# Patient Record
Sex: Female | Born: 1960
Health system: Southern US, Community
[De-identification: ages and names within clinical notes are randomized; demographics above are authoritative.]

## PROBLEM LIST (undated history)

## (undated) DIAGNOSIS — T7840XA Allergy, unspecified, initial encounter: Secondary | ICD-10-CM

## (undated) DIAGNOSIS — N200 Calculus of kidney: Secondary | ICD-10-CM

## (undated) DIAGNOSIS — E042 Nontoxic multinodular goiter: Secondary | ICD-10-CM

## (undated) DIAGNOSIS — S73191A Other sprain of right hip, initial encounter: Secondary | ICD-10-CM

## (undated) DIAGNOSIS — Z87442 Personal history of urinary calculi: Secondary | ICD-10-CM

## (undated) DIAGNOSIS — E785 Hyperlipidemia, unspecified: Secondary | ICD-10-CM

## (undated) DIAGNOSIS — M810 Age-related osteoporosis without current pathological fracture: Secondary | ICD-10-CM

## (undated) DIAGNOSIS — T8859XA Other complications of anesthesia, initial encounter: Secondary | ICD-10-CM

## (undated) DIAGNOSIS — K603 Anal fistula, unspecified: Secondary | ICD-10-CM

## (undated) DIAGNOSIS — K648 Other hemorrhoids: Secondary | ICD-10-CM

## (undated) DIAGNOSIS — Z973 Presence of spectacles and contact lenses: Secondary | ICD-10-CM

## (undated) DIAGNOSIS — F419 Anxiety disorder, unspecified: Secondary | ICD-10-CM

## (undated) DIAGNOSIS — M1990 Unspecified osteoarthritis, unspecified site: Secondary | ICD-10-CM

## (undated) DIAGNOSIS — T4145XA Adverse effect of unspecified anesthetic, initial encounter: Secondary | ICD-10-CM

## (undated) HISTORY — PX: BREAST SURGERY: SHX581

## (undated) HISTORY — DX: Other sprain of right hip, initial encounter: S73.191A

## (undated) HISTORY — DX: Allergy, unspecified, initial encounter: T78.40XA

## (undated) HISTORY — DX: Anxiety disorder, unspecified: F41.9

## (undated) HISTORY — PX: TONSILLECTOMY: SUR1361

## (undated) HISTORY — PX: LAPAROSCOPY: SHX197

## (undated) HISTORY — DX: Unspecified osteoarthritis, unspecified site: M19.90

## (undated) HISTORY — DX: Hyperlipidemia, unspecified: E78.5

## (undated) HISTORY — DX: Age-related osteoporosis without current pathological fracture: M81.0

## (undated) HISTORY — PX: REVISION OF SCAR: SHX2348

## (undated) HISTORY — DX: Calculus of kidney: N20.0

## (undated) HISTORY — PX: COLONOSCOPY: SHX174

## (undated) HISTORY — PX: CARDIOVASCULAR STRESS TEST: SHX262

---

## 1998-10-08 ENCOUNTER — Other Ambulatory Visit: Admission: RE | Admit: 1998-10-08 | Discharge: 1998-10-08 | Payer: Self-pay | Admitting: Obstetrics and Gynecology

## 1999-06-19 ENCOUNTER — Encounter: Admission: RE | Admit: 1999-06-19 | Discharge: 1999-06-19 | Payer: Self-pay | Admitting: Gastroenterology

## 1999-06-19 ENCOUNTER — Encounter: Payer: Self-pay | Admitting: Gastroenterology

## 1999-07-31 ENCOUNTER — Ambulatory Visit (HOSPITAL_COMMUNITY): Admission: RE | Admit: 1999-07-31 | Discharge: 1999-07-31 | Payer: Self-pay | Admitting: Urology

## 1999-07-31 ENCOUNTER — Encounter: Payer: Self-pay | Admitting: Urology

## 1999-10-09 ENCOUNTER — Other Ambulatory Visit: Admission: RE | Admit: 1999-10-09 | Discharge: 1999-10-09 | Payer: Self-pay | Admitting: Obstetrics and Gynecology

## 2000-10-08 ENCOUNTER — Other Ambulatory Visit: Admission: RE | Admit: 2000-10-08 | Discharge: 2000-10-08 | Payer: Self-pay | Admitting: Obstetrics and Gynecology

## 2001-10-10 ENCOUNTER — Other Ambulatory Visit: Admission: RE | Admit: 2001-10-10 | Discharge: 2001-10-10 | Payer: Self-pay | Admitting: Obstetrics and Gynecology

## 2002-09-07 ENCOUNTER — Other Ambulatory Visit: Admission: RE | Admit: 2002-09-07 | Discharge: 2002-09-07 | Payer: Self-pay | Admitting: Obstetrics and Gynecology

## 2003-09-10 ENCOUNTER — Other Ambulatory Visit: Admission: RE | Admit: 2003-09-10 | Discharge: 2003-09-10 | Payer: Self-pay | Admitting: Obstetrics and Gynecology

## 2004-04-21 ENCOUNTER — Encounter: Admission: RE | Admit: 2004-04-21 | Discharge: 2004-04-21 | Payer: Self-pay | Admitting: Internal Medicine

## 2004-09-11 ENCOUNTER — Other Ambulatory Visit: Admission: RE | Admit: 2004-09-11 | Discharge: 2004-09-11 | Payer: Self-pay | Admitting: Obstetrics and Gynecology

## 2005-01-02 ENCOUNTER — Encounter: Admission: RE | Admit: 2005-01-02 | Discharge: 2005-01-02 | Payer: Self-pay | Admitting: Internal Medicine

## 2005-09-15 ENCOUNTER — Other Ambulatory Visit: Admission: RE | Admit: 2005-09-15 | Discharge: 2005-09-15 | Payer: Self-pay | Admitting: Obstetrics and Gynecology

## 2006-09-20 ENCOUNTER — Other Ambulatory Visit: Admission: RE | Admit: 2006-09-20 | Discharge: 2006-09-20 | Payer: Self-pay | Admitting: Obstetrics and Gynecology

## 2008-03-16 HISTORY — PX: LITHOTRIPSY: SUR834

## 2011-04-08 ENCOUNTER — Other Ambulatory Visit: Payer: Self-pay | Admitting: Orthopedic Surgery

## 2011-04-08 DIAGNOSIS — M25512 Pain in left shoulder: Secondary | ICD-10-CM

## 2011-04-12 ENCOUNTER — Ambulatory Visit
Admission: RE | Admit: 2011-04-12 | Discharge: 2011-04-12 | Disposition: A | Discharge status: Home / Self Care | Payer: 59 | Source: Ambulatory Visit | Attending: Orthopedic Surgery | Admitting: Orthopedic Surgery

## 2011-04-12 DIAGNOSIS — M25512 Pain in left shoulder: Secondary | ICD-10-CM

## 2012-05-10 ENCOUNTER — Encounter (INDEPENDENT_AMBULATORY_CARE_PROVIDER_SITE_OTHER): Payer: Self-pay

## 2012-05-19 ENCOUNTER — Ambulatory Visit (INDEPENDENT_AMBULATORY_CARE_PROVIDER_SITE_OTHER): Payer: 59 | Admitting: Surgery

## 2012-05-19 ENCOUNTER — Encounter (INDEPENDENT_AMBULATORY_CARE_PROVIDER_SITE_OTHER): Payer: Self-pay | Admitting: Surgery

## 2012-05-19 ENCOUNTER — Other Ambulatory Visit (INDEPENDENT_AMBULATORY_CARE_PROVIDER_SITE_OTHER): Payer: Self-pay

## 2012-05-19 VITALS — BP 118/82 | HR 68 | Temp 97.3°F | Resp 16 | Ht 64.0 in | Wt 110.0 lb

## 2012-05-19 DIAGNOSIS — K6289 Other specified diseases of anus and rectum: Secondary | ICD-10-CM

## 2012-05-19 NOTE — Patient Instructions (Signed)
Followup with our office after the MRI of the pelvis

## 2012-05-19 NOTE — Progress Notes (Signed)
Chief Complaint:  Palpable knot on left anterior perirectal region  History of Present Illness:  Heather Chandler is an 52 y.o. female had a recent colonscopy per Dr. Randa Evens and was found to have a firm mass in the perianal region.  This is anterior and on the left side. It is about a centimeter in diameter and about a centimeter from the anal verge. On the inside there is palpable fullness and I would think this could have been a pararectal abscess; however she gives no history to suggest that she has ever had a perianal or perirectal abscess. Since this could be a sarcoma I think she would be better to image this before doing any invasive needle studies her biopsy. Hopefully this will be just scar tissue and any invasive procedure could be avoided.  History reviewed. No pertinent past medical history.  Past Surgical History  Procedure Laterality Date  . Laparoscopy    . Breast surgery      enhancement Sx  . Revision of scar      Current Outpatient Prescriptions  Medication Sig Dispense Refill  . diazepam (VALIUM) 5 MG tablet        No current facility-administered medications for this visit.   Ceclor; Codeine; and Penicillins Family History  Problem Relation Age of Onset  . Cancer Mother     breast & uterian  . Cancer Father     leukemia  . Cancer Maternal Aunt     breast  . Cancer Maternal Grandmother     breast  . Cancer Maternal Grandfather     lung   Social History:   reports that she has never smoked. She has never used smokeless tobacco. She reports that  drinks alcohol. She reports that she does not use illicit drugs.   REVIEW OF SYSTEMS - PERTINENT POSITIVES ONLY: No history of perirectal abscess.  Did have 1 episiotomy with childbirth  Physical Exam:   Blood pressure 118/82, pulse 68, temperature 97.3 F (36.3 C), temperature source Temporal, resp. rate 16, height 5\' 4"  (1.626 m), weight 110 lb (49.896 kg). Body mass index is 18.87 kg/(m^2).  Gen:  WDWN WF NAD   Neurological: Alert and oriented to person, place, and time. Motor and sensory function is grossly intact  Head: Normocephalic and atraumatic.  Eyes: Conjunctivae are normal. Pupils are equal, round, and reactive to light. No scleral icterus.  Rectal: Small anterior external hemorrhoid.  Palpable mass on the left anterior sphincter region about 1 cm in diameter and about 1 cm from the anal verge.  Musculoskeletal: Normal range of motion. Extremities are nontender. No cyanosis, edema or clubbing noted Lymphadenopathy: No cervical, preauricular, postauricular or axillary adenopathy is present Skin: Skin is warm and dry. No rash noted. No diaphoresis. No erythema. No pallor. Pscyh: Normal mood and affect. Behavior is normal. Judgment and thought content normal.   LABORATORY RESULTS: No results found for this or any previous visit (from the past 48 hour(s)).  RADIOLOGY RESULTS: No results found.  Problem List: There is no problem list on file for this patient.   Assessment & Plan: Perianal mass.  Will get MRI of pelvis to try to discern scar from possilbe muscle tumor    Matt B. Daphine Deutscher, MD, [facility] [phone] beeper [phone]  05/19/2012 10:16 AM

## 2012-05-22 ENCOUNTER — Ambulatory Visit
Admission: RE | Admit: 2012-05-22 | Discharge: 2012-05-22 | Disposition: A | Discharge status: Home / Self Care | Payer: 59 | Source: Ambulatory Visit | Attending: Surgery | Admitting: Surgery

## 2012-05-22 DIAGNOSIS — K6289 Other specified diseases of anus and rectum: Secondary | ICD-10-CM

## 2012-05-22 MED ORDER — GADOBENATE DIMEGLUMINE 529 MG/ML IV SOLN
10.0000 mL | Freq: Once | INTRAVENOUS | Status: AC | PRN
  Administered 2012-05-22: 10 mL via INTRAVENOUS

## 2012-05-24 ENCOUNTER — Telehealth (INDEPENDENT_AMBULATORY_CARE_PROVIDER_SITE_OTHER): Payer: Self-pay

## 2012-05-24 NOTE — Telephone Encounter (Signed)
LMOM letting the pt know that her imaging results were in.  I did not see anywhere that it is OK to leave results on VM.  Asked that pt call the office for results.  Also, I made and appointment for her to come in on Fri, April 7 @ 415p.

## 2012-05-24 NOTE — Telephone Encounter (Signed)
While documenting, pt returned my call.  Pt aware of results and appt.

## 2012-05-31 DIAGNOSIS — K6289 Other specified diseases of anus and rectum: Secondary | ICD-10-CM | POA: Insufficient documentation

## 2012-06-16 ENCOUNTER — Telehealth (INDEPENDENT_AMBULATORY_CARE_PROVIDER_SITE_OTHER): Payer: Self-pay | Admitting: *Deleted

## 2012-06-16 NOTE — Telephone Encounter (Signed)
Patient called to state that she would prefer to not have to come into her appt tomorrow since to her understanding it is just to received her MRI results which has already has.  Please let her know if it is ok to cancel appt.

## 2012-06-17 ENCOUNTER — Telehealth (INDEPENDENT_AMBULATORY_CARE_PROVIDER_SITE_OTHER): Payer: Self-pay

## 2012-06-17 ENCOUNTER — Telehealth (INDEPENDENT_AMBULATORY_CARE_PROVIDER_SITE_OTHER): Payer: Self-pay | Admitting: *Deleted

## 2012-06-17 NOTE — Telephone Encounter (Signed)
Called and left message for patient that it is ok to cancel Monday's appt if she doesn't feel like she needs to come in.  Appt cancelled at this time.

## 2012-06-17 NOTE — Telephone Encounter (Signed)
Returned pt's call regarding her upcoming appt with MM.  I let the pt know that I did not see a reason that she HAD to come into the office and that MM would most likely not have a problem giving her a call about her results (which were given to pt via phone on an earlier date).  I also let the pt know that MM is not due back in the office until 4p on Monday, so she should expect his call after that time.  Pt appreciated my call and said she does not have a problem scheduling for late May if MM feels that she needs to be seen again.

## 2012-06-20 ENCOUNTER — Telehealth (INDEPENDENT_AMBULATORY_CARE_PROVIDER_SITE_OTHER): Payer: Self-pay

## 2012-06-20 ENCOUNTER — Encounter (INDEPENDENT_AMBULATORY_CARE_PROVIDER_SITE_OTHER): Payer: 59 | Admitting: Surgery

## 2012-06-20 NOTE — Telephone Encounter (Signed)
Called pt as I said i would in our last conversation.  I let her know that MM did not see any reason for her to come in today.  Pt stated that every once in a blue moon the area gets tender after a bowel movement.  I let her know that this may have more to do with the location of the area (perirectal) than with the scar tissue.  I also suggested that the pt use baby wipes and a topical cream on the area when it bothers her.  Pt appreciated my call.

## 2012-07-21 ENCOUNTER — Telehealth (INDEPENDENT_AMBULATORY_CARE_PROVIDER_SITE_OTHER): Payer: Self-pay

## 2012-07-21 NOTE — Telephone Encounter (Signed)
Pt is having intermittent discomfort in rectal area.  She would like to know if Dr. Daphine Deutscher would call in a Rx for Lidocaine cream to [facility] in [place], [phone].  I told her I would forward this message to his nurse.

## 2012-12-06 ENCOUNTER — Telehealth (INDEPENDENT_AMBULATORY_CARE_PROVIDER_SITE_OTHER): Payer: Self-pay | Admitting: General Surgery

## 2012-12-06 NOTE — Telephone Encounter (Signed)
Pt of Dr. Daphine Deutscher seen in March and, per MRI then, informed her peri-rectal pain from old scar tissue.  She is having a re-occurance of the pain; has appt with her GYN on 12/29/12.  Discussed warm tub or sitz soaks with epsom salts, application of ice (frozen peas) and ibuprofen for discomfort.  Paged and updated Dr. Daphine Deutscher, who ordered Lidocaine 2% cream, # 30 grams, AAA prn pain, no RF.  Med called to [facility]:  [phone].  Pt will be mailed copy of MRI report and MD notes of OV on 05/19/12.  Gave her contact information to request CD from [facility] if GYN wants to see the actual images.  She understands all.

## 2012-12-13 ENCOUNTER — Encounter (INDEPENDENT_AMBULATORY_CARE_PROVIDER_SITE_OTHER): Payer: Self-pay

## 2013-01-05 ENCOUNTER — Encounter (INDEPENDENT_AMBULATORY_CARE_PROVIDER_SITE_OTHER): Payer: Self-pay | Admitting: Surgery

## 2013-01-05 ENCOUNTER — Ambulatory Visit (INDEPENDENT_AMBULATORY_CARE_PROVIDER_SITE_OTHER): Payer: 59 | Admitting: Surgery

## 2013-01-05 VITALS — BP 114/76 | HR 64 | Temp 97.4°F | Resp 14 | Ht 64.0 in | Wt 107.8 lb

## 2013-01-05 DIAGNOSIS — K611 Rectal abscess: Secondary | ICD-10-CM | POA: Insufficient documentation

## 2013-01-05 DIAGNOSIS — K603 Anal fistula: Secondary | ICD-10-CM

## 2013-01-05 DIAGNOSIS — K612 Anorectal abscess: Secondary | ICD-10-CM

## 2013-01-05 MED ORDER — METRONIDAZOLE 500 MG PO TABS: 500.0000 mg | ORAL_TABLET | Freq: Three times a day (TID) | ORAL | Status: DC

## 2013-01-05 NOTE — Patient Instructions (Addendum)
Sit in a bathtub with warm water and Epsom salt twice daily. Take the Cipro and Flagyl for at least 7 days. If this abscess recurs then I may be important for [place] to examine you under anesthesia and probe the abscess tract

## 2013-01-05 NOTE — Progress Notes (Signed)
Problem:  Presentation of apparent fistula in ano  This is a followup visit for Mrs. Revak who I saw earlier in the year after a colonoscopy and Vilinda Boehringer finding of a subtle tender area on the left side anteriorly. She had no history of a perirectal abscess and on MRI scan at that time was felt to possibly have remnants of a anal fistula. She was asymptomatic from this until several days ago when she began having more pain on left side anteriorly and this opened up and drained while she was seeing her gynecologist, Dr. Duane Lope. She had been placed on Cipro for a bladder infection. This subsequently sealed and then re\re drained last night and currently is much less tender. There is an open area about the 2 cm from the anal verge. I did a rectal exam and did not feel a mass in and did an anoscopy which showed what looks like scar tissue in the midline. I ask her if she had had an episiotomy or fourth degree laceration when she gave birth some 20 years ago. She could not recall.  Will plan to add Flagyl to regimen because she is allergic to penicillin and keep her on that for 7-10 days.  I like to see her back in about 3-4 weeks. If this tender mass recurs then I think an exam under anesthesia is warranted. She is very uncomfortable with examinations in the office and normally tenderness but with anoscopy.  Plan addition of Flagyl to Cipro for 7-10 days. Follow up exam in the office. Would move toward exam under anesthesia if this recurs.

## 2013-01-09 ENCOUNTER — Telehealth (INDEPENDENT_AMBULATORY_CARE_PROVIDER_SITE_OTHER): Payer: Self-pay

## 2013-01-09 NOTE — Telephone Encounter (Signed)
Pt calling to ask how long she should do her sitz baths and how long to take Flagyl.  Dr. Ermalene Searing note prescribed the sitz baths and the Flagyl for 7 days from the date of her visit.  I confirmed she should continue both until 10/30.  She reports the drainage from her abscess is brownish in color.  I explained that should taper off with time, and to wear protection in her underclothes. Keep the area clean and dry during the day.  She will return to see Dr. Daphine Deutscher 01/31/13.

## 2013-01-12 ENCOUNTER — Telehealth (INDEPENDENT_AMBULATORY_CARE_PROVIDER_SITE_OTHER): Payer: Self-pay

## 2013-01-12 NOTE — Telephone Encounter (Signed)
Pt calling in to report that the Flagyl is making her feel really bad with nausea and a headache. The pt said she can not take anymore of the Flagyl. The pt wants to know if she should be worried about feeling this way after taken the Flagyl and does she need another abx. Please advise.

## 2013-01-20 ENCOUNTER — Encounter (INDEPENDENT_AMBULATORY_CARE_PROVIDER_SITE_OTHER): Payer: Self-pay | Admitting: Surgery

## 2013-01-20 ENCOUNTER — Ambulatory Visit (INDEPENDENT_AMBULATORY_CARE_PROVIDER_SITE_OTHER): Payer: 59 | Admitting: Surgery

## 2013-01-20 VITALS — BP 110/64 | HR 72 | Temp 98.2°F | Resp 16 | Ht 64.0 in | Wt 110.0 lb

## 2013-01-20 DIAGNOSIS — K603 Anal fistula: Secondary | ICD-10-CM

## 2013-01-20 NOTE — Progress Notes (Signed)
Chief Complaint:  persistant fistula in the left anterior quadrant  History of Present Illness:  Heather Chandler is an 52 y.o. female has continued to have a symptomatic fistula anteriorally.  She didn't tolerate Flagyl very well.  Will proceed with EUA and sphincterotomy  History reviewed. No pertinent past medical history.  Past Surgical History  Procedure Laterality Date  . Laparoscopy    . Breast surgery      enhancement Sx  . Revision of scar      Current Outpatient Prescriptions  Medication Sig Dispense Refill  . diazepam (VALIUM) 5 MG tablet       . lidocaine (XYLOCAINE) 2 % jelly       . raloxifene (EVISTA) 60 MG tablet        No current facility-administered medications for this visit.   Ceclor; Codeine; and Penicillins Family History  Problem Relation Age of Onset  . Cancer Mother     breast & uterian  . Cancer Father     leukemia  . Cancer Maternal Aunt     breast  . Cancer Maternal Grandmother     breast  . Cancer Maternal Grandfather     lung   Social History:   reports that she has never smoked. She has never used smokeless tobacco. She reports that she drinks alcohol. She reports that she does not use illicit drugs.   REVIEW OF SYSTEMS - PERTINENT POSITIVES ONLY: Non contributory  Physical Exam:   Blood pressure 110/64, pulse 72, temperature 98.2 F (36.8 C), temperature source Temporal, resp. rate 16, height 5\' 4"  (1.626 m), weight 110 lb (49.896 kg). Body mass index is 18.87 kg/(m^2).  Gen:  WDWN WF NAD  Neurological: Alert and oriented to person, place, and time. Motor and sensory function is grossly intact  Head: Normocephalic and atraumatic.  Eyes: Conjunctivae are normal. Pupils are equal, round, and reactive to light. No scleral icterus.  Neck: Normal range of motion. Neck supple. No tracheal deviation or thyromegaly present.  Cardiovascular:  SR without murmurs or gallops.  No carotid bruits Respiratory: Effort normal.  No respiratory  distress. No chest wall tenderness. Breath sounds normal.  No wheezes, rales or rhonchi.  Abdomen:  nontender GU: small opening that continues to drain with induration Musculoskeletal: Normal range of motion. Extremities are nontender. No cyanosis, edema or clubbing noted Lymphadenopathy: No cervical, preauricular, postauricular or axillary adenopathy is present Skin: Skin is warm and dry. No rash noted. No diaphoresis. No erythema. No pallor. Pscyh: Normal mood and affect. Behavior is normal. Judgment and thought content normal.   LABORATORY RESULTS: No results found for this or any previous visit (from the past 48 hour(s)).  RADIOLOGY RESULTS: No results found.  Problem List: Patient Active Problem List   Diagnosis Date Noted  . Perirectal abscess 01/05/2013  . Mass of perirectal soft tissue 05/31/2012    Assessment & Plan: persistant fistula in ano.      Matt B. Daphine Deutscher, MD, [facility] [phone] beeper [phone]  01/20/2013 10:25 AM

## 2013-01-20 NOTE — Patient Instructions (Signed)
Anal Fistula °An anal fistula is an abnormal tunnel that develops between the bowel and skin near the outside of the anus, where feces comes out. The anus has a number of tiny glands that make lubricating fluid. Sometimes these glands can become plugged and infected. This may lead to the development of a fluid-filled pocket (abscess). An anal fistula often develops after this infection or abscess. It is nearly always caused by a past or current anal abscess.  °CAUSES  °Though an anal fistula is almost always caused by a past or current anal abscess, other causes can include: °· A complication of surgery. °· Trauma to the rectal area. °· Radiation to the area. °· Other medical conditions or diseases, such as:   °· Chronic inflammatory bowel disease, such as Crohn disease or ulcerative colitis.   °· Colon or rectal cancer.   °· Diverticular disease, such as diverticulitis.   °· A sexually transmitted disease, such as gonorrhea, chlamydia, or syphilis. °· An HIV infection or AIDS.   °SYMPTOMS  °· Throbbing or constant pain that may be worse when sitting.   °· Swelling or irritation around the anus.   °· Drainage of pus or blood from an opening near the anus.   °· Pain with bowel movements. °· Fever or chills. °DIAGNOSIS  °Your caregiver will examine the area to find the openings of the anal fistula and the fistula tract. The external opening of the anal fistula may be seen during a physical examination. Other examinations that may be performed include:  °· Examination of the rectal area with a gloved hand (digital rectal exam).   °· Examination with a probe or scope to help locate the internal opening of the fistula.   °· Injection of a dye into the fistula opening. X-rays can be taken to find the exact location and path of the fistula.   °· An MRI or ultrasound of the anal area.   °Other tests may be performed to find the cause of the anal fistula.    °TREATMENT  °The most common treatment for an anal fistula is  surgery. There are different surgery options depending on where your fistula is located and how complex the fistula is. Surgical options include: °· A fistulotomy. This surgery involves opening up the whole fistula and draining the contents inside to promote healing. °· Seton placement. A silk string (seton) is placed into the fistula during a fistulotomy to drain any infection to promote healing. °· Advancement flap procedure. Tissue is removed from your rectum or the skin around the anus and is attached to the opening of the fistula. °· Bioprosthetic plug. A cone-shaped plug is made from your tissue and is used to block the opening of the fistula. °Some anal fistulas do not require surgery. A fibrin glue is a non-surgical option that involves injecting the glue to seal the fistula. You also may be prescribed an antibiotic medicine to treat an infection.  °HOME CARE INSTRUCTIONS  °· Take your antibiotics as directed. Finish them even if you start to feel better. °· Only take over-the-counter or prescription medicines as directed by your caregiver. Use a stool softener or laxative, if recommended.   °· Eat a high-fiber diet to help avoid constipation or as directed by your caregiver. °· Drink enough water to keep your urine clear or pale yellow.   °· A warm sitz bath may be soothing and help with healing. You may take warm sitz baths for 15 20 minutes, 3 4 times a day to ease pain and discomfort.   °· Follow excellent hygiene to keep the   anal area as clean and dry as possible. Use wet toilet paper or moist towelettes after each bowel movement.   °SEEK MEDICAL CARE IF: °You have increased pain not controlled with medicines.  °SEEK IMMEDIATE MEDICAL CARE IF: °· You have severe, intolerable pain. °· You have new swelling, redness, or discharge around the anal area. °· You have tenderness or warmth around the anal area. °· You have chills or diarrhea. °· You have severe problems urinating or having a bowel movement.    °· You have a fever or persistent symptoms for more than 2 3 days.   °· You have a fever and your symptoms suddenly get worse.   °MAKE SURE YOU:  °· Understand these instructions. °· Will watch your condition. °· Will get help right away if you are not doing well or get worse. °Document Released: 02/13/2008 Document Revised: 02/17/2012 Document Reviewed: 01/05/2011 °Exit[employer]® Patient Information ©2014 [employer]. ° °

## 2013-01-31 ENCOUNTER — Encounter (INDEPENDENT_AMBULATORY_CARE_PROVIDER_SITE_OTHER): Payer: 59 | Admitting: Surgery

## 2013-02-14 ENCOUNTER — Telehealth (INDEPENDENT_AMBULATORY_CARE_PROVIDER_SITE_OTHER): Payer: Self-pay | Admitting: *Deleted

## 2013-02-14 NOTE — Telephone Encounter (Signed)
Patient called to ask if Dr. Daphine Deutscher could give her a call.  She has some questions to ask before having surgery.  Patient states she wants to cancel her surgery if she can't speak with him prior.  Explained that I would send a message to Dr. Daphine Deutscher to make him aware.  Patient states understanding.

## 2013-02-20 ENCOUNTER — Other Ambulatory Visit (INDEPENDENT_AMBULATORY_CARE_PROVIDER_SITE_OTHER): Payer: Self-pay | Admitting: Surgery

## 2013-02-20 ENCOUNTER — Telehealth (INDEPENDENT_AMBULATORY_CARE_PROVIDER_SITE_OTHER): Payer: Self-pay | Admitting: Surgery

## 2013-02-20 NOTE — Telephone Encounter (Signed)
Patient has called again.  She states she had a message from Chadron asking about coming in 12/18 to speak with him however patient's surgery is 12/17.  I text Dr. Daphine Deutscher at this time to see if he would be willing to call the patient.

## 2013-02-20 NOTE — Telephone Encounter (Signed)
I called and spoke with Heather Chandler about her upcoming EUA and fistulectomy versus Seton placement.  She had been reading the internet and this raised some concerns.  I explained the approach to this problem to avoid incontinence issues.  I answered all of the questions that she had for now and plan to proceed with surgery next week.

## 2013-02-21 ENCOUNTER — Encounter (HOSPITAL_COMMUNITY): Payer: Self-pay | Admitting: Pharmacy Technician

## 2013-02-23 ENCOUNTER — Encounter (HOSPITAL_COMMUNITY): Payer: Self-pay

## 2013-02-23 ENCOUNTER — Encounter (HOSPITAL_COMMUNITY)
Admission: RE | Admit: 2013-02-23 | Discharge: 2013-02-23 | Disposition: A | Discharge status: Home / Self Care | Payer: 59 | Source: Ambulatory Visit | Attending: Surgery | Admitting: Surgery

## 2013-02-23 DIAGNOSIS — Z01812 Encounter for preprocedural laboratory examination: Secondary | ICD-10-CM | POA: Insufficient documentation

## 2013-02-23 HISTORY — DX: Anal fistula: K60.3

## 2013-02-23 HISTORY — DX: Other complications of anesthesia, initial encounter: T88.59XA

## 2013-02-23 HISTORY — DX: Anal fistula, unspecified: K60.30

## 2013-02-23 HISTORY — DX: Adverse effect of unspecified anesthetic, initial encounter: T41.45XA

## 2013-02-23 HISTORY — DX: Nontoxic multinodular goiter: E04.2

## 2013-02-23 HISTORY — DX: Personal history of urinary calculi: Z87.442

## 2013-02-23 LAB — CBC
Hemoglobin: 12.4 g/dL (ref 12.0–15.0)
MCH: 30 pg (ref 26.0–34.0)
Platelets: 258 10*3/uL (ref 150–400)
RBC: 4.14 MIL/uL (ref 3.87–5.11)
WBC: 4.7 10*3/uL (ref 4.0–10.5)

## 2013-02-23 NOTE — Pre-Procedure Instructions (Signed)
preop ekg and cxr not needed per anesthesiologist's guidelines.

## 2013-02-23 NOTE — Patient Instructions (Signed)
YOUR SURGERY IS SCHEDULED AT [facility]  ON:  Wednesday  12/17  REPORT TO  SHORT STAY CENTER AT:  8:30 AM      PHONE # FOR SHORT STAY IS [phone]  STOP  ASPIRIN, HERBALS 5 DAYS BEFORE SURGERY.  FLEETS ENEMA NIGHT BEFORE YOUR SURGERY.  DO NOT EAT OR DRINK ANYTHING AFTER MIDNIGHT THE NIGHT BEFORE YOUR SURGERY.  YOU MAY BRUSH YOUR TEETH, RINSE OUT YOUR MOUTH--BUT NO WATER, NO FOOD, NO CHEWING GUM, NO MINTS, NO CANDIES, NO CHEWING TOBACCO.  PLEASE TAKE THE FOLLOWING MEDICATIONS THE AM OF YOUR SURGERY WITH A FEW SIPS OF WATER:  NO MEDS TO TAKE    DO NOT BRING VALUABLES, MONEY, CREDIT CARDS.  DO NOT WEAR JEWELRY, MAKE-UP, NAIL POLISH AND NO METAL PINS OR CLIPS IN YOUR HAIR. CONTACT LENS, DENTURES / PARTIALS, GLASSES SHOULD NOT BE WORN TO SURGERY AND IN MOST CASES-HEARING AIDS WILL NEED TO BE REMOVED.  BRING YOUR GLASSES CASE, ANY EQUIPMENT NEEDED FOR YOUR CONTACT LENS. FOR PATIENTS ADMITTED TO THE HOSPITAL--CHECK OUT TIME THE DAY OF DISCHARGE IS 11:00 AM.  ALL INPATIENT ROOMS ARE PRIVATE - WITH BATHROOM, TELEPHONE, TELEVISION AND WIFI INTERNET.  IF YOU ARE BEING DISCHARGED THE SAME DAY OF YOUR SURGERY--YOU CAN NOT DRIVE YOURSELF HOME--AND SHOULD NOT GO HOME ALONE BY TAXI OR BUS.  NO DRIVING OR OPERATING MACHINERY, DO NOT MAKE LEGAL DECISIONS FOR 24 HOURS FOLLOWING ANESTHESIA / PAIN MEDICATIONS.  PLEASE MAKE ARRANGEMENTS FOR SOMEONE TO BE WITH YOU AT HOME THE FIRST 24 HOURS AFTER SURGERY. RESPONSIBLE DRIVER'S NAME / PHONE  PT'S MOTHER FAYE CHEEK  [phone]                                                   FAILURE TO FOLLOW THESE INSTRUCTIONS MAY RESULT IN THE CANCELLATION OF YOUR SURGERY. PLEASE BE AWARE THAT YOU MAY NEED ADDITIONAL BLOOD DRAWN DAY OF YOUR SURGERY  PATIENT SIGNATURE_________________________________

## 2013-03-01 ENCOUNTER — Encounter (HOSPITAL_COMMUNITY)
Admission: RE | Disposition: A | Discharge status: Home / Self Care | Payer: Self-pay | Source: Ambulatory Visit | Attending: Surgery

## 2013-03-01 ENCOUNTER — Encounter (HOSPITAL_COMMUNITY): Payer: Self-pay | Admitting: *Deleted

## 2013-03-01 ENCOUNTER — Ambulatory Visit (HOSPITAL_COMMUNITY)
Admission: RE | Admit: 2013-03-01 | Discharge: 2013-03-01 | Disposition: A | Discharge status: Home / Self Care | Payer: 59 | Source: Ambulatory Visit | Attending: Surgery | Admitting: Surgery

## 2013-03-01 ENCOUNTER — Ambulatory Visit (HOSPITAL_COMMUNITY): Payer: 59 | Admitting: Anesthesiology

## 2013-03-01 ENCOUNTER — Encounter (HOSPITAL_COMMUNITY): Payer: 59 | Admitting: Anesthesiology

## 2013-03-01 DIAGNOSIS — K603 Anal fistula, unspecified: Secondary | ICD-10-CM | POA: Insufficient documentation

## 2013-03-01 DIAGNOSIS — K623 Rectal prolapse: Secondary | ICD-10-CM | POA: Insufficient documentation

## 2013-03-01 HISTORY — PX: EVALUATION UNDER ANESTHESIA WITH ANAL FISTULECTOMY: SHX5621

## 2013-03-01 SURGERY — EXAM UNDER ANESTHESIA WITH ANAL FISTULECTOMY
Anesthesia: General | Site: Anus

## 2013-03-01 MED ORDER — PROPOFOL INFUSION 10 MG/ML OPTIME
INTRAVENOUS | Status: DC | PRN
  Administered 2013-03-01 (×2): 10 mL via INTRAVENOUS
  Administered 2013-03-01: 100 mL via INTRAVENOUS

## 2013-03-01 MED ORDER — LACTATED RINGERS IV SOLN: INTRAVENOUS | Status: DC

## 2013-03-01 MED ORDER — PROMETHAZINE HCL 25 MG/ML IJ SOLN: 6.2500 mg | INTRAMUSCULAR | Status: DC | PRN

## 2013-03-01 MED ORDER — GLYCOPYRROLATE 0.2 MG/ML IJ SOLN
INTRAMUSCULAR | Status: AC
  Filled 2013-03-01: qty 1

## 2013-03-01 MED ORDER — LIDOCAINE HCL (CARDIAC) 20 MG/ML IV SOLN
INTRAVENOUS | Status: DC | PRN
  Administered 2013-03-01: 30 mg via INTRAVENOUS

## 2013-03-01 MED ORDER — FENTANYL CITRATE 0.05 MG/ML IJ SOLN
INTRAMUSCULAR | Status: DC | PRN
  Administered 2013-03-01 (×2): 25 ug via INTRAVENOUS
  Administered 2013-03-01: 12.5 ug via INTRAVENOUS
  Administered 2013-03-01: 25 ug via INTRAVENOUS
  Administered 2013-03-01: 12.5 ug via INTRAVENOUS

## 2013-03-01 MED ORDER — HYDROGEN PEROXIDE 3 % EX SOLN
CUTANEOUS | Status: AC
  Filled 2013-03-01: qty 473

## 2013-03-01 MED ORDER — LIDOCAINE HCL (CARDIAC) 20 MG/ML IV SOLN
INTRAVENOUS | Status: AC
  Filled 2013-03-01: qty 5

## 2013-03-01 MED ORDER — LACTATED RINGERS IV SOLN
INTRAVENOUS | Status: DC
  Administered 2013-03-01: 1000 mL via INTRAVENOUS

## 2013-03-01 MED ORDER — HEPARIN SODIUM (PORCINE) 5000 UNIT/ML IJ SOLN
5000.0000 [IU] | Freq: Once | INTRAMUSCULAR | Status: AC
  Administered 2013-03-01: 5000 [IU] via SUBCUTANEOUS
  Filled 2013-03-01: qty 1

## 2013-03-01 MED ORDER — CHLORHEXIDINE GLUCONATE 4 % EX LIQD: 1.0000 "application " | Freq: Once | CUTANEOUS | Status: DC

## 2013-03-01 MED ORDER — ONDANSETRON HCL 4 MG/2ML IJ SOLN
INTRAMUSCULAR | Status: AC
  Filled 2013-03-01: qty 2

## 2013-03-01 MED ORDER — LACTATED RINGERS IV SOLN
INTRAVENOUS | Status: DC | PRN
  Administered 2013-03-01: 10:00:00 via INTRAVENOUS

## 2013-03-01 MED ORDER — BUPIVACAINE LIPOSOME 1.3 % IJ SUSP
20.0000 mL | Freq: Once | INTRAMUSCULAR | Status: AC
  Administered 2013-03-01: 10 mL
  Filled 2013-03-01: qty 20

## 2013-03-01 MED ORDER — PROPOFOL 10 MG/ML IV BOLUS
INTRAVENOUS | Status: AC
  Filled 2013-03-01: qty 20

## 2013-03-01 MED ORDER — HYDROMORPHONE HCL PF 1 MG/ML IJ SOLN: 0.2500 mg | INTRAMUSCULAR | Status: DC | PRN

## 2013-03-01 MED ORDER — ONDANSETRON HCL 4 MG/2ML IJ SOLN
INTRAMUSCULAR | Status: DC | PRN
  Administered 2013-03-01 (×2): 2 mg via INTRAVENOUS

## 2013-03-01 MED ORDER — FENTANYL CITRATE 0.05 MG/ML IJ SOLN
INTRAMUSCULAR | Status: AC
  Filled 2013-03-01: qty 2

## 2013-03-01 MED ORDER — DEXTROSE 5 % IV SOLN
INTRAVENOUS | Status: AC
  Filled 2013-03-01 (×2): qty 1

## 2013-03-01 MED ORDER — SODIUM CHLORIDE 0.9 % IJ SOLN
INTRAMUSCULAR | Status: AC
  Filled 2013-03-01: qty 10

## 2013-03-01 SURGICAL SUPPLY — 35 items
BLADE HEX COATED 2.75 (ELECTRODE) ×2 IMPLANT
BLADE SURG 15 STRL LF DISP TIS (BLADE) ×1 IMPLANT
BLADE SURG 15 STRL SS (BLADE) ×2
BRIEF STRETCH FOR OB PAD LRG (UNDERPADS AND DIAPERS) ×2 IMPLANT
CANISTER SUCTION 2500CC (MISCELLANEOUS) ×2 IMPLANT
DECANTER SPIKE VIAL GLASS SM (MISCELLANEOUS) ×2 IMPLANT
ELECT REM PT RETURN 9FT ADLT (ELECTROSURGICAL) ×2
ELECTRODE REM PT RTRN 9FT ADLT (ELECTROSURGICAL) ×1 IMPLANT
GAUZE SPONGE 4X4 16PLY XRAY LF (GAUZE/BANDAGES/DRESSINGS) IMPLANT
GLOVE BIOGEL M 8.0 STRL (GLOVE) ×2 IMPLANT
GLOVE BIOGEL PI IND STRL 7.0 (GLOVE) ×1 IMPLANT
GLOVE BIOGEL PI INDICATOR 7.0 (GLOVE) ×1
GOWN PREVENTION PLUS LG XLONG (DISPOSABLE) IMPLANT
GOWN STRL REIN XL XLG (GOWN DISPOSABLE) ×6 IMPLANT
KIT BASIN OR (CUSTOM PROCEDURE TRAY) ×2 IMPLANT
LOOP VESSEL MAXI BLUE (MISCELLANEOUS) ×2 IMPLANT
LUBRICANT JELLY K Y 4OZ (MISCELLANEOUS) ×2 IMPLANT
NDL SAFETY ECLIPSE 18X1.5 (NEEDLE) IMPLANT
NEEDLE BLUNT 17GA (NEEDLE) ×2 IMPLANT
NEEDLE HYPO 18GX1.5 SHARP (NEEDLE)
NEEDLE HYPO 25X1 1.5 SAFETY (NEEDLE) ×2 IMPLANT
NS IRRIG 1000ML POUR BTL (IV SOLUTION) ×2 IMPLANT
PACK LITHOTOMY IV (CUSTOM PROCEDURE TRAY) ×2 IMPLANT
PAD ABD 8X10 STRL (GAUZE/BANDAGES/DRESSINGS) ×2 IMPLANT
PENCIL BUTTON HOLSTER BLD 10FT (ELECTRODE) ×2 IMPLANT
SPONGE GAUZE 4X4 12PLY (GAUZE/BANDAGES/DRESSINGS) ×2 IMPLANT
SPONGE SURGIFOAM ABS GEL 100 (HEMOSTASIS) IMPLANT
SPONGE SURGIFOAM ABS GEL 12-7 (HEMOSTASIS) IMPLANT
SUT CHROMIC 2 0 SH (SUTURE) IMPLANT
SUT CHROMIC 3 0 SH 27 (SUTURE) IMPLANT
SUT ETHIBOND NAB CT1 #1 30IN (SUTURE) ×2 IMPLANT
SYR 20CC LL (SYRINGE) ×2 IMPLANT
SYR CONTROL 10ML LL (SYRINGE) ×2 IMPLANT
UNDERPAD 30X30 INCONTINENT (UNDERPADS AND DIAPERS) ×2 IMPLANT
YANKAUER SUCT BULB TIP 10FT TU (MISCELLANEOUS) ×2 IMPLANT

## 2013-03-01 NOTE — Op Note (Signed)
Surgeon: Romie Levee, MD  Asst:  Wenda Low, MD, FACS  Anes:  general  Procedure: EUA, placement of a Seton and mucosal biopsy  Diagnosis: Fistula in ano  Complications: none  EBL:   minimal cc  Description of Procedure:  The patient was taken to room 11 in given general anesthesia via LMA. She was placed up in the dorsal lithotomy position. The clustered area was on the left side at about the 3:00 position approximately 3 cm from the anal verge. Initially injected this with some hydrogen peroxide which bubbled in the anterior midline. Examination revealed that she had had probably a fourth degree laceration and had a rectal prolapse and very little muscle anteriorly. At that point I felt like Dr. Maisie Fus would need to handle this in a manner that she could become from a width that she would be the one most likely to reconstruction of her anterior rectal sling. I therefore turned the case over to her which time she identified the fistula and placed a seton. She made dictate this in a separate operative note. At the end of the case I injected 10 cc of Exparel in the perianal region for pain control. Patient will followup with Dr. Romie Levee in the office.  Matt B. Daphine Deutscher, MD, [facility] [place] [phone]

## 2013-03-01 NOTE — Transfer of Care (Signed)
Immediate Anesthesia Transfer of Care Note  Patient: Heather Chandler  Procedure(s) Performed: Procedure(s): EXAM UNDER ANESTHESIA WITH, ANAL BIOPSY, SETON PLACEMENT (N/A)  Patient Location: PACU  Anesthesia Type:General  Level of Consciousness: awake and sedated  Airway & Oxygen Therapy: Patient Spontanous Breathing and Patient connected to face mask oxygen  Post-op Assessment: Report given to PACU RN and Post -op Vital signs reviewed and stable  Post vital signs: stable  Complications: No apparent anesthesia complications

## 2013-03-01 NOTE — H&P (Signed)
Chief Complaint: persistant fistula in the left anterior quadrant  History of Present Illness: Heather Chandler is an 52 y.o. female has continued to have a symptomatic fistula anteriorally. She didn't tolerate Flagyl very well. Will proceed with EUA and sphincterotomy  History reviewed. No pertinent past medical history.  Past Surgical History   Procedure  Laterality  Date   .  Laparoscopy     .  Breast surgery       enhancement Sx   .  Revision of scar      Current Outpatient Prescriptions   Medication  Sig  Dispense  Refill   .  diazepam (VALIUM) 5 MG tablet      .  lidocaine (XYLOCAINE) 2 % jelly      .  raloxifene (EVISTA) 60 MG tablet       No current facility-administered medications for this visit.   Ceclor; Codeine; and Penicillins  Family History   Problem  Relation  Age of Onset   .  Cancer  Mother      breast & uterian   .  Cancer  Father      leukemia   .  Cancer  Maternal Aunt      breast   .  Cancer  Maternal Grandmother      breast   .  Cancer  Maternal Grandfather      lung   Social History: reports that she has never smoked. She has never used smokeless tobacco. She reports that she drinks alcohol. She reports that she does not use illicit drugs.  REVIEW OF SYSTEMS - PERTINENT POSITIVES ONLY:  Non contributory  Physical Exam:  Blood pressure 110/64, pulse 72, temperature 98.2 F (36.8 C), temperature source Temporal, resp. rate 16, height 5\' 4"  (1.626 m), weight 110 lb (49.896 kg).  Body mass index is 18.87 kg/(m^2).  Gen: WDWN WF NAD  Neurological: Alert and oriented to person, place, and time. Motor and sensory function is grossly intact  Head: Normocephalic and atraumatic.  Eyes: Conjunctivae are normal. Pupils are equal, round, and reactive to light. No scleral icterus.  Neck: Normal range of motion. Neck supple. No tracheal deviation or thyromegaly present.  Cardiovascular: SR without murmurs or gallops. No carotid bruits  Respiratory: Effort normal. No  respiratory distress. No chest wall tenderness. Breath sounds normal. No wheezes, rales or rhonchi.  Abdomen: nontender  GU: small opening that continues to drain with induration  Musculoskeletal: Normal range of motion. Extremities are nontender. No cyanosis, edema or clubbing noted Lymphadenopathy: No cervical, preauricular, postauricular or axillary adenopathy is present Skin: Skin is warm and dry. No rash noted. No diaphoresis. No erythema. No pallor. Pscyh: Normal mood and affect. Behavior is normal. Judgment and thought content normal.  LABORATORY RESULTS:  No results found for this or any previous visit (from the past 48 hour(s)).  RADIOLOGY RESULTS:  No results found.  Problem List:  Patient Active Problem List    Diagnosis  Date Noted   .  Perirectal abscess  01/05/2013   .  Mass of perirectal soft tissue  05/31/2012   Assessment & Plan:  persistant fistula in ano.  Matt B. Daphine Deutscher, MD, [facility]  [phone] beeper  [phone]

## 2013-03-01 NOTE — Progress Notes (Signed)
Dr. Martin talked with patient. 

## 2013-03-01 NOTE — Anesthesia Postprocedure Evaluation (Signed)
Anesthesia Post Note  Patient: Heather Chandler  Procedure(s) Performed: Procedure(s) (LRB): EXAM UNDER ANESTHESIA WITH, ANAL BIOPSY, SETON PLACEMENT (N/A)  Anesthesia type: General  Patient location: PACU  Post pain: Pain level controlled  Post assessment: Post-op Vital signs reviewed  Last Vitals:  Filed Vitals:   03/01/13 1312  BP: 111/63  Pulse: 64  Temp: 36.4 C  Resp: 16    Post vital signs: Reviewed  Level of consciousness: sedated  Complications: No apparent anesthesia complications

## 2013-03-01 NOTE — Anesthesia Preprocedure Evaluation (Signed)
Anesthesia Evaluation  Patient identified by MRN, date of birth, ID band Patient awake    Reviewed: Allergy & Precautions, H&P , NPO status , Patient's Chart, lab work & pertinent test results  History of Anesthesia Complications (+) PROLONGED EMERGENCE  Airway Mallampati: II TM Distance: >3 FB Neck ROM: Full    Dental  (+) Teeth Intact and Dental Advisory Given   Pulmonary neg pulmonary ROS,  breath sounds clear to auscultation  Pulmonary exam normal       Cardiovascular negative cardio ROS  Rhythm:Regular     Neuro/Psych negative neurological ROS  negative psych ROS   GI/Hepatic negative GI ROS, Neg liver ROS,   Endo/Other  negative endocrine ROS  Renal/GU negative Renal ROS  negative genitourinary   Musculoskeletal negative musculoskeletal ROS (+)   Abdominal   Peds  Hematology negative hematology ROS (+)   Anesthesia Other Findings   Reproductive/Obstetrics negative OB ROS                           Anesthesia Physical Anesthesia Plan  ASA: I  Anesthesia Plan: General   Post-op Pain Management:    Induction: Intravenous  Airway Management Planned: LMA  Additional Equipment:   Intra-op Plan:   Post-operative Plan: Extubation in OR  Informed Consent: I have reviewed the patients History and Physical, chart, labs and discussed the procedure including the risks, benefits and alternatives for the proposed anesthesia with the patient or authorized representative who has indicated his/her understanding and acceptance.   Dental advisory given  Plan Discussed with: CRNA  Anesthesia Plan Comments:         Anesthesia Quick Evaluation

## 2013-03-02 ENCOUNTER — Encounter (INDEPENDENT_AMBULATORY_CARE_PROVIDER_SITE_OTHER): Payer: 59 | Admitting: Surgery

## 2013-03-02 ENCOUNTER — Encounter (HOSPITAL_COMMUNITY): Payer: Self-pay | Admitting: Surgery

## 2013-03-03 NOTE — Op Note (Addendum)
03/01/2013  10:31 AM  PATIENT:  Heather Chandler  52 y.o. female  Patient Care Team: Kari Baars, MD as PCP - General (Internal Medicine)  PRE-OPERATIVE DIAGNOSIS:  FISTULA IN ANO   POST-OPERATIVE DIAGNOSIS:  fistula in ano  PROCEDURE:  Procedure(s): EXAM UNDER ANESTHESIA WITH, ANAL BIOPSY, SETON PLACEMENT  SURGEON:  Surgeon(s): Valarie Merino, MD Romie Levee, MD  I was asked by my partner to assist with a anorectal fistula.  The patient was brought into the room and general anesthesia was induced.  She was placed in lithotomy position and prepped and draped in the usual fashion. We began with a digital rectal exam.  There was a moderate rectocele and what appeared to be vaginal prolapse from the anterior compartment.  There was a surgical scar noted on the posterior vaginal wall.  There was also an anterior anal mass.  I then placed a Hill-Ferguson anoscope into the anal canal and evaluated this completely.  There were some small white plaques noted anteriorly.  One was removed using sharp dissection and sent to pathology for further review.  There was a external opening with granulation tissue in the left anterior perianal region.  The external opening was injected with peroxide.  This located the internal opening to the anterior dentate line. An S Shaped fistula probe was placed through the external opening, and I was able to pass this into the internal opening.  An Ethibond suture was passed through the fistula tract and a vessel loop was pulled back through and secured with the Ethibond suture in 3 places.  It was felt that there was significant anterior sphincter loss and that a fistulotomy was not prudent.  The patient was then injected with local anesthesia and a sterile dressing was applied.  She was then awakened from anesthesia and sent to the PACU in stable condition.

## 2013-03-06 ENCOUNTER — Telehealth (INDEPENDENT_AMBULATORY_CARE_PROVIDER_SITE_OTHER): Payer: Self-pay | Admitting: *Deleted

## 2013-03-06 NOTE — Telephone Encounter (Signed)
I called and spoke with pt to check on her postoperatively.  She states she is doing good just a little sore.  She did have questions as to how long the seton would stay in place.  I answered her questions and instructed her to call our office with any other questions or concerns.  I informed her of her postop appt with Dr. Daphine Deutscher on 03/15/13 @ 9:30am.  Pt is agreeable with this plan at this time.

## 2013-03-08 ENCOUNTER — Encounter (INDEPENDENT_AMBULATORY_CARE_PROVIDER_SITE_OTHER): Payer: 59 | Admitting: Surgery

## 2013-03-15 ENCOUNTER — Encounter (INDEPENDENT_AMBULATORY_CARE_PROVIDER_SITE_OTHER): Payer: Self-pay | Admitting: Surgery

## 2013-03-15 ENCOUNTER — Ambulatory Visit (INDEPENDENT_AMBULATORY_CARE_PROVIDER_SITE_OTHER): Payer: 59 | Admitting: Surgery

## 2013-03-15 VITALS — BP 122/74 | HR 80 | Temp 98.2°F | Resp 16 | Ht 64.0 in | Wt 105.0 lb

## 2013-03-15 DIAGNOSIS — K603 Anal fistula: Secondary | ICD-10-CM

## 2013-03-15 DIAGNOSIS — N816 Rectocele: Secondary | ICD-10-CM

## 2013-03-15 NOTE — Patient Instructions (Signed)
Thanks for your patience.  If you need further assistance after leaving the office, please call our office and speak with a [facility] nurse.  [phone].  If you want to leave a message for Dr. Herma Uballe, please call his office phone at [phone]. 

## 2013-03-15 NOTE — Progress Notes (Signed)
Heather Chandler 52 y.o.  Body mass index is 18.01 kg/(m^2).  Patient Active Problem List   Diagnosis Date Noted  . Perirectal abscess 01/05/2013  . Mass of perirectal soft tissue 05/31/2012    Allergies  Allergen Reactions  . Ceclor [Cefaclor] Rash  . Codeine     Went into shock - REACTION AS A CHILD - ? FAINTED --DID NOT HAVE TO GO TO ER  . Penicillins Rash  . Flagyl [Metronidazole]     MAKES THINGS TASTE LIKE METAL, CAUSED DIARRHEA, FELT TERRIBLE - HAD TO STAY OUT OF WORK  . Morphine And Related     AFTER A LITHOTRIPSY - MORPHINE MADE PT VERY SLEEPY AND NAUSEATED AND SHE HAD VOMITED.    Past Surgical History  Procedure Laterality Date  . Laparoscopy    . Breast surgery      enhancement Sx  . Revision of scar    . Tonsillectomy    . Evaluation under anesthesia with anal fistulectomy N/A 03/01/2013    Procedure: EXAM UNDER ANESTHESIA WITH, ANAL BIOPSY, SETON PLACEMENT;  Surgeon: Valarie Merino, MD;  Location: [facility];  Service: General;  Laterality: N/A;   Kari Baars, MD No diagnosis found.  Postop Seton placement for attenuated anterior muscle and rectocele.  Will need to see Dr Maisie Fus about further rectocele repair.   Path given to her (benign) and explained Education administrator.   Followup with Dr. Rowe Clack B. Daphine Deutscher, MD, [facility] [phone] beeper [phone]  03/15/2013 10:29 AM

## 2013-03-23 ENCOUNTER — Ambulatory Visit: Payer: Self-pay | Admitting: Diagnostic Neuroimaging

## 2013-03-28 ENCOUNTER — Encounter (INDEPENDENT_AMBULATORY_CARE_PROVIDER_SITE_OTHER): Payer: Self-pay | Admitting: General Surgery

## 2013-03-28 ENCOUNTER — Ambulatory Visit (INDEPENDENT_AMBULATORY_CARE_PROVIDER_SITE_OTHER): Payer: 59 | Admitting: General Surgery

## 2013-03-28 VITALS — BP 110/78 | HR 78 | Temp 97.0°F | Resp 18 | Ht 64.0 in | Wt 106.0 lb

## 2013-03-28 DIAGNOSIS — K605 Anorectal fistula: Secondary | ICD-10-CM

## 2013-03-28 DIAGNOSIS — K603 Anal fistula: Secondary | ICD-10-CM

## 2013-03-28 NOTE — Patient Instructions (Signed)
We will schedule an ultrasound to evaluate your sphincter muscles

## 2013-03-28 NOTE — Progress Notes (Signed)
Heather Chandler is a 53 y.o. female who is status post a Seton placement for anal rectal fistula on December 12.  Her pain is now starting to resolve. She is having regular bowel movements. She is having occasional drainage.  Objective: Filed Vitals:   03/28/13 1132  BP: 110/78  Pulse: 78  Temp: 97 F (36.1 C)  Resp: 18    General appearance: alert and cooperative seton in place, still with some mild inflammation  Incision: healing well   Assessment: s/p  Patient Active Problem List   Diagnosis Date Noted  . Fistula-in-ano-Seton placed Dec 2014 03/15/2013  . Rectocele 03/15/2013    Plan: I have recommended that she undergo an anal ultrasound to evaluate her sphincter continuity. If she has an intact sphincter we should be able to perform a mucosal advancement flap or lift procedure. If her sphincter is not intact I think the best treatment would be a sphincteroplasty. We will plan on doing this in 2-3 weeks. I will see her back in the office after this to discuss further treatment.    Rosario Adie, [facility] [place]   03/28/2013 1:18 PM

## 2013-04-19 ENCOUNTER — Ambulatory Visit (HOSPITAL_COMMUNITY)
Admission: RE | Admit: 2013-04-19 | Discharge: 2013-04-19 | Disposition: A | Discharge status: Home / Self Care | Payer: 59 | Source: Ambulatory Visit | Attending: General Surgery | Admitting: General Surgery

## 2013-04-19 ENCOUNTER — Encounter (HOSPITAL_COMMUNITY)
Admission: RE | Disposition: A | Discharge status: Home / Self Care | Payer: Self-pay | Source: Ambulatory Visit | Attending: General Surgery

## 2013-04-19 DIAGNOSIS — K603 Anal fistula, unspecified: Secondary | ICD-10-CM | POA: Insufficient documentation

## 2013-04-19 DIAGNOSIS — Z8049 Family history of malignant neoplasm of other genital organs: Secondary | ICD-10-CM | POA: Insufficient documentation

## 2013-04-19 DIAGNOSIS — N816 Rectocele: Secondary | ICD-10-CM | POA: Insufficient documentation

## 2013-04-19 DIAGNOSIS — Z803 Family history of malignant neoplasm of breast: Secondary | ICD-10-CM | POA: Insufficient documentation

## 2013-04-19 DIAGNOSIS — Z806 Family history of leukemia: Secondary | ICD-10-CM | POA: Insufficient documentation

## 2013-04-19 DIAGNOSIS — Z801 Family history of malignant neoplasm of trachea, bronchus and lung: Secondary | ICD-10-CM | POA: Insufficient documentation

## 2013-04-19 HISTORY — PX: RECTAL ULTRASOUND: SHX2306

## 2013-04-19 SURGERY — US RECTUM
Site: Anus

## 2013-04-19 NOTE — Discharge Instructions (Signed)
We will call you to schedule an apt in 3-4 weeks.

## 2013-04-19 NOTE — H&P (Signed)
  Chief Complaint: persistant fistula in the left anterior quadrant  History of Present Illness: Heather Chandler is an 53 y.o. female has continued to have a symptomatic fistula anteriorally. She didn't tolerate Flagyl very well.  EUA showed an anterior fistula tracking to the L ant perianal region.  A seton was placed. History reviewed. No pertinent past medical history.  Past Surgical History   Procedure  Laterality  Date   .  Laparoscopy     .  Breast surgery       enhancement Sx   .  Revision of scar      Current Outpatient Prescriptions   Medication  Sig  Dispense  Refill   .  diazepam (VALIUM) 5 MG tablet      .  lidocaine (XYLOCAINE) 2 % jelly      .  raloxifene (EVISTA) 60 MG tablet       No current facility-administered medications for this visit.   Ceclor; Codeine; and Penicillins  Family History   Problem  Relation  Age of Onset   .  Cancer  Mother      breast & uterian   .  Cancer  Father      leukemia   .  Cancer  Maternal Aunt      breast   .  Cancer  Maternal Grandmother      breast   .  Cancer  Maternal Grandfather      lung   Social History: reports that she has never smoked. She has never used smokeless tobacco. She reports that she drinks alcohol. She reports that she does not use illicit drugs.  REVIEW OF SYSTEMS - PERTINENT POSITIVES ONLY:  Non contributory  Physical Exam:  Filed Vitals:   04/19/13 0833  BP: 129/73  Pulse: 83  Temp: 97.5 F (36.4 C)  Resp: 20   Gen: WDWN WF NAD  Neurological: Alert and oriented to person, place, and time. Motor and sensory function is grossly intact  Head: Normocephalic and atraumatic.  Eyes: Conjunctivae are normal. Pupils are equal, round, and reactive to light. No scleral icterus.  Neck: Normal range of motion. Neck supple. No tracheal deviation or thyromegaly present.  Cardiovascular: SR without murmurs or gallops.  Respiratory: Effort normal. No respiratory distress. Abdomen: nontender  Musculoskeletal: Normal  range of motion. Extremities are nontender. No cyanosis, edema or clubbing noted Skin: Skin is warm and dry. No rash noted. No diaphoresis. No erythema. No pallor.  Pscyh: Normal mood and affect. Behavior is normal. Judgment and thought content normal.  LABORATORY RESULTS:  No results found for this or any previous visit (from the past 48 hour(s)).  RADIOLOGY RESULTS:  No results found.  Problem List:  Patient Active Problem List    Diagnosis  Date Noted   .  Perirectal abscess  01/05/2013   .  Mass of perirectal soft tissue  05/31/2012   Assessment & Plan:  persistant fistula in ano. S/p seton placement.  Anal [place] to evaluate sphincter complex.  Risks and benefits described to pt.  Pt agrees to procedure.  Consent signed.

## 2013-04-19 NOTE — Op Note (Signed)
04/19/2013  9:22 AM  PATIENT:  Heather Chandler  53 y.o. female  Patient Care Team: Janalyn Rouse, MD as PCP - General (Internal Medicine)  PRE-OPERATIVE DIAGNOSIS:  Anterior anorectal fistula  POST-OPERATIVE DIAGNOSIS:  same  PROCEDURE:  anal ULTRASOUND  SURGEON:  Surgeon(s): Leighton Ruff, MD  ANESTHESIA:   none  EBL: none  PATIENT DISPOSITION:  PACU - hemodynamically stable.   INDICATION: 53yo F with thin rectovaginal septum and anterior fistula, eval for sphincter continence   OR FINDINGS: L anterior anal fistula, 90 degree opening of proximal external sphincter and 45 degree opening of distal EAS. 30 degree opening of internal sphincter  DESCRIPTION: The patient was identified in the holding area and taken to the procedure room where they were laid in lateral decubitus position on the exam room table.  A timeout was performed indicating the correct patient and procedure.  I began by performing a digital rectal exam.  There was a small rectocele. There were no abnormal masses.   The US probe was inserted without difficulty.  The levators were identified and the probe was withdrawn slowly.  The internal sphincter was identified and followed distally. There was a 30 degree opening of internal sphincter.  The external sphincter was also identified and followed distally.  The L anterior anal fistula was seen.  There was a 90 degree opening of proximal external sphincter and 45 degree break of distal EAS.  The procedure was completed without difficulty.

## 2013-04-20 ENCOUNTER — Encounter (HOSPITAL_COMMUNITY): Payer: Self-pay | Admitting: General Surgery

## 2013-05-12 ENCOUNTER — Telehealth (INDEPENDENT_AMBULATORY_CARE_PROVIDER_SITE_OTHER): Payer: Self-pay

## 2013-05-12 NOTE — Telephone Encounter (Signed)
Pt called asking if seeing a small amount of bleeding is normal. Pt advised as seton moves thru tissue she may have a small spot of blood. Pt encouraged to increase tub soaks to 2 or 3 times a day. Pt has appt on 05-22-13. Pt to call if bleeding,swelling or pain increase.

## 2013-05-22 ENCOUNTER — Ambulatory Visit (INDEPENDENT_AMBULATORY_CARE_PROVIDER_SITE_OTHER): Payer: 59 | Admitting: General Surgery

## 2013-05-22 ENCOUNTER — Encounter (INDEPENDENT_AMBULATORY_CARE_PROVIDER_SITE_OTHER): Payer: Self-pay | Admitting: General Surgery

## 2013-05-22 VITALS — BP 128/80 | HR 80 | Temp 97.7°F | Resp 16 | Ht 64.0 in | Wt 105.8 lb

## 2013-05-22 DIAGNOSIS — K603 Anal fistula: Secondary | ICD-10-CM

## 2013-05-22 DIAGNOSIS — K605 Anorectal fistula: Secondary | ICD-10-CM

## 2013-05-22 NOTE — Patient Instructions (Signed)
Call the office if you'd like to schedule surgery.

## 2013-05-22 NOTE — Progress Notes (Signed)
KINDEL ROCHEFORT is a 53 y.o. female who is here for a follow up visit regarding Her perirectal fistula. She has an anterior internal opening. MRI and ultrasound indicate a small defect anteriorly. She denies any perianal pain. She has occasional drainage.  Objective: Filed Vitals:   05/22/13 1527  BP: 128/80  Pulse: 80  Temp: 97.7 F (36.5 C)  Resp: 16    General appearance: alert and cooperative Resp: clear to auscultation bilaterally Cardio: regular rate and rhythm GI: normal findings: soft, non-tender Perianal: Seton in place, good fistula tract formation, no evidence of recurrent abscess  Assessment and Plan: KAMILYA WAKEMAN Is a 53 year old female who presents to the office with a known anal rectal fistula. She is approximately 3 months status post seton placement. MRI a year ago as well as ultrasound performed recently shows a small anterior defect in her sphincter complex. We discussed the possible lift procedure as well as sphincteroplasty. We had a long discussion about the risk and benefits of each procedure. I think it is reasonable to try the left procedure but given that he muscle defect anteriorly I am concerned that this may not work as well as the 80% success rate that is usually quoted the patient. I did offer her the sphincteroplasty. She would like to think about this further. I also offered her a second opinion with Dr. Marita Snellen at [facility] given that she lives in [place]. She will call the office if she would like [place] to refer her there or schedule surgery.    Rosario Adie, MD [facility], [place]

## 2013-06-02 ENCOUNTER — Other Ambulatory Visit (INDEPENDENT_AMBULATORY_CARE_PROVIDER_SITE_OTHER): Payer: Self-pay

## 2013-06-02 DIAGNOSIS — K605 Anorectal fistula: Secondary | ICD-10-CM

## 2013-06-06 ENCOUNTER — Telehealth (INDEPENDENT_AMBULATORY_CARE_PROVIDER_SITE_OTHER): Payer: Self-pay | Admitting: *Deleted

## 2013-06-06 NOTE — Telephone Encounter (Signed)
Left pt message regarding her appt with Dr. Farrel Conners 06/21/13 @11 :00. Address: [address] [place], [place] [address], ph# [phone].Marland Kitchenjkw

## 2013-06-06 NOTE — Telephone Encounter (Signed)
Per pt she has already scheduled an appt for 3.25.15 with Dr. Audie Clear.  I called [facility] to confirm and cancelled the appt below.Heather Chandler

## 2013-06-13 ENCOUNTER — Telehealth (INDEPENDENT_AMBULATORY_CARE_PROVIDER_SITE_OTHER): Payer: Self-pay

## 2013-06-13 NOTE — Telephone Encounter (Signed)
Patient advised  decreased drainage was a good sign but to call if pain, Redness, temp 100.3. Patient verbalized understanding

## 2013-06-13 NOTE — Telephone Encounter (Signed)
Patient states she had a C Ton ,the draining has stopped, She would is asking if she should be concerned . Please advise

## 2013-06-13 NOTE — Telephone Encounter (Signed)
Usually that is a good s ign as long as she has no increasing pain or redness.  The drainage may wax and wane

## 2013-07-17 ENCOUNTER — Ambulatory Visit (INDEPENDENT_AMBULATORY_CARE_PROVIDER_SITE_OTHER): Payer: 59 | Admitting: General Surgery

## 2013-07-17 ENCOUNTER — Encounter (INDEPENDENT_AMBULATORY_CARE_PROVIDER_SITE_OTHER): Payer: Self-pay | Admitting: General Surgery

## 2013-07-17 VITALS — BP 128/70 | HR 80 | Temp 97.2°F | Resp 14 | Ht 64.0 in | Wt 107.0 lb

## 2013-07-17 DIAGNOSIS — K603 Anal fistula: Secondary | ICD-10-CM

## 2013-07-17 NOTE — Progress Notes (Signed)
CARLIE SOLORZANO is a 53 y.o. female who is here for a follow up visit regarding Her perirectal fistula. She has an anterior midline internal opening. MRI and ultrasound indicate a small defect anteriorly. She denies any perianal pain. She has occasional drainage.  Objective:  Filed Vitals:   07/17/13 1452  BP: 128/70  Pulse: 80  Temp: 97.2 F (36.2 C)  Resp: 14    General appearance: alert and cooperative  Resp: clear to auscultation bilaterally  Cardio: regular rate and rhythm  GI: normal findings: soft, non-tender  Perianal: Seton in place, good fistula tract formation, no evidence of recurrent abscess   Assessment and Plan:  MERIAM CHOJNOWSKI Is a 53 year old female who presents to the office with a known anal rectal fistula. She is approximately 4-5 months status post seton placement. MRI a year ago as well as ultrasound performed recently shows a small anterior defect in her sphincter complex. We discussed the possible lift procedure as well as sphincteroplasty and fisutlotomy. We had a long discussion about the risk and benefits of each procedure. I think it is reasonable to try the LIFT procedure but given that he muscle defect anteriorly I am concerned that this may not work as well as the 80% success rate that is usually quoted the patient. I did offer her the sphincteroplasty. I do not feel that fistulotomy would be a good idea.  She would like to think about this further.  She will call when she is ready to proceed.

## 2013-07-17 NOTE — Patient Instructions (Signed)
Return to the office when you are ready to proceed with further treatment.

## 2013-07-27 ENCOUNTER — Telehealth (INDEPENDENT_AMBULATORY_CARE_PROVIDER_SITE_OTHER): Payer: Self-pay

## 2013-07-27 NOTE — Telephone Encounter (Signed)
Consulted with Dr. Johney Maine since Dr. Marcello Moores unavailable this afternoon.  Continue sitz baths TID.  No peroxide and only baby wipes (Wet Ones).  May use cotton balls and replace with fresh one after each visit to the bathroom.  Drainage is normal and may even become yellow in color or tinged with stool at times.  Make sure you get fiber in your diet daily.  Call us if worsens in next 48 hours.  Pt understood directions and agreed with POC.

## 2013-07-27 NOTE — Telephone Encounter (Signed)
Pt of Dr. Marcello Moores with a seton last seen on 07/17/13.  She is calling today to report that the area around her Heather Chandler is red and very sore.  Draining a light pink fluid.  Started this past Monday.  Since then she has been doing daily soaks.  She cleaned the area with a medicated cloth and peroxide this past Monday.  I suggested she leave the area alone for now and keep it clean and dry.  Will make Dr. Marcello Moores aware.

## 2014-01-03 ENCOUNTER — Other Ambulatory Visit: Payer: Self-pay | Admitting: Obstetrics and Gynecology

## 2014-01-04 LAB — CYTOLOGY - PAP

## 2014-01-12 ENCOUNTER — Other Ambulatory Visit: Payer: Self-pay | Admitting: Internal Medicine

## 2014-01-12 DIAGNOSIS — E049 Nontoxic goiter, unspecified: Secondary | ICD-10-CM

## 2014-01-12 DIAGNOSIS — R946 Abnormal results of thyroid function studies: Secondary | ICD-10-CM

## 2014-01-16 ENCOUNTER — Ambulatory Visit
Admission: RE | Admit: 2014-01-16 | Discharge: 2014-01-16 | Disposition: A | Discharge status: Home / Self Care | Payer: 59 | Source: Ambulatory Visit | Attending: Internal Medicine | Admitting: Internal Medicine

## 2014-01-16 DIAGNOSIS — E049 Nontoxic goiter, unspecified: Secondary | ICD-10-CM

## 2014-01-16 DIAGNOSIS — R946 Abnormal results of thyroid function studies: Secondary | ICD-10-CM

## 2014-01-22 ENCOUNTER — Other Ambulatory Visit: Payer: Self-pay | Admitting: Internal Medicine

## 2014-01-23 ENCOUNTER — Other Ambulatory Visit: Payer: Self-pay | Admitting: Internal Medicine

## 2014-01-23 DIAGNOSIS — E041 Nontoxic single thyroid nodule: Secondary | ICD-10-CM

## 2014-01-30 ENCOUNTER — Other Ambulatory Visit (HOSPITAL_COMMUNITY)
Admission: RE | Admit: 2014-01-30 | Discharge: 2014-01-30 | Disposition: A | Discharge status: Home / Self Care | Payer: 59 | Source: Ambulatory Visit | Attending: Interventional Radiology | Admitting: Interventional Radiology

## 2014-01-30 ENCOUNTER — Ambulatory Visit
Admission: RE | Admit: 2014-01-30 | Discharge: 2014-01-30 | Disposition: A | Discharge status: Home / Self Care | Payer: 59 | Source: Ambulatory Visit | Attending: Internal Medicine | Admitting: Internal Medicine

## 2014-01-30 DIAGNOSIS — E041 Nontoxic single thyroid nodule: Secondary | ICD-10-CM | POA: Diagnosis present

## 2016-05-19 DIAGNOSIS — R1012 Left upper quadrant pain: Secondary | ICD-10-CM | POA: Diagnosis not present

## 2016-05-19 DIAGNOSIS — E042 Nontoxic multinodular goiter: Secondary | ICD-10-CM | POA: Diagnosis not present

## 2016-05-19 DIAGNOSIS — D72819 Decreased white blood cell count, unspecified: Secondary | ICD-10-CM | POA: Diagnosis not present

## 2016-05-20 ENCOUNTER — Other Ambulatory Visit: Payer: Self-pay | Admitting: Internal Medicine

## 2016-05-20 DIAGNOSIS — E049 Nontoxic goiter, unspecified: Secondary | ICD-10-CM

## 2016-06-02 DIAGNOSIS — L03213 Periorbital cellulitis: Secondary | ICD-10-CM | POA: Diagnosis not present

## 2016-06-08 ENCOUNTER — Ambulatory Visit
Admission: RE | Admit: 2016-06-08 | Discharge: 2016-06-08 | Disposition: A | Discharge status: Home / Self Care | Payer: 59 | Source: Ambulatory Visit | Attending: Internal Medicine | Admitting: Internal Medicine

## 2016-06-08 DIAGNOSIS — E049 Nontoxic goiter, unspecified: Secondary | ICD-10-CM

## 2016-06-08 DIAGNOSIS — E042 Nontoxic multinodular goiter: Secondary | ICD-10-CM | POA: Diagnosis not present

## 2016-06-17 DIAGNOSIS — Z7689 Persons encountering health services in other specified circumstances: Secondary | ICD-10-CM | POA: Diagnosis not present

## 2016-07-09 DIAGNOSIS — M25551 Pain in right hip: Secondary | ICD-10-CM | POA: Diagnosis not present

## 2016-09-06 DIAGNOSIS — S61201A Unspecified open wound of left index finger without damage to nail, initial encounter: Secondary | ICD-10-CM | POA: Diagnosis not present

## 2016-09-06 DIAGNOSIS — Z23 Encounter for immunization: Secondary | ICD-10-CM | POA: Diagnosis not present

## 2016-12-17 DIAGNOSIS — Z23 Encounter for immunization: Secondary | ICD-10-CM | POA: Diagnosis not present

## 2017-01-18 DIAGNOSIS — M810 Age-related osteoporosis without current pathological fracture: Secondary | ICD-10-CM | POA: Diagnosis not present

## 2017-01-25 DIAGNOSIS — Z01419 Encounter for gynecological examination (general) (routine) without abnormal findings: Secondary | ICD-10-CM | POA: Diagnosis not present

## 2017-01-25 DIAGNOSIS — Z124 Encounter for screening for malignant neoplasm of cervix: Secondary | ICD-10-CM | POA: Diagnosis not present

## 2017-01-29 DIAGNOSIS — M810 Age-related osteoporosis without current pathological fracture: Secondary | ICD-10-CM | POA: Diagnosis not present

## 2017-01-29 DIAGNOSIS — E119 Type 2 diabetes mellitus without complications: Secondary | ICD-10-CM | POA: Diagnosis not present

## 2017-01-29 DIAGNOSIS — Z008 Encounter for other general examination: Secondary | ICD-10-CM | POA: Diagnosis not present

## 2017-02-03 DIAGNOSIS — D72819 Decreased white blood cell count, unspecified: Secondary | ICD-10-CM | POA: Diagnosis not present

## 2017-02-03 DIAGNOSIS — E042 Nontoxic multinodular goiter: Secondary | ICD-10-CM | POA: Diagnosis not present

## 2017-02-03 DIAGNOSIS — Z0000 Encounter for general adult medical examination without abnormal findings: Secondary | ICD-10-CM | POA: Diagnosis not present

## 2017-02-03 DIAGNOSIS — M810 Age-related osteoporosis without current pathological fracture: Secondary | ICD-10-CM | POA: Diagnosis not present

## 2017-03-03 DIAGNOSIS — Z1231 Encounter for screening mammogram for malignant neoplasm of breast: Secondary | ICD-10-CM | POA: Diagnosis not present

## 2017-03-03 DIAGNOSIS — Z803 Family history of malignant neoplasm of breast: Secondary | ICD-10-CM | POA: Diagnosis not present

## 2017-04-26 DIAGNOSIS — D485 Neoplasm of uncertain behavior of skin: Secondary | ICD-10-CM | POA: Diagnosis not present

## 2017-04-26 DIAGNOSIS — L821 Other seborrheic keratosis: Secondary | ICD-10-CM | POA: Diagnosis not present

## 2017-04-26 DIAGNOSIS — D2272 Melanocytic nevi of left lower limb, including hip: Secondary | ICD-10-CM | POA: Diagnosis not present

## 2017-04-26 DIAGNOSIS — D224 Melanocytic nevi of scalp and neck: Secondary | ICD-10-CM | POA: Diagnosis not present

## 2017-04-26 DIAGNOSIS — L570 Actinic keratosis: Secondary | ICD-10-CM | POA: Diagnosis not present

## 2017-04-26 DIAGNOSIS — Z1283 Encounter for screening for malignant neoplasm of skin: Secondary | ICD-10-CM | POA: Diagnosis not present

## 2017-04-26 DIAGNOSIS — L814 Other melanin hyperpigmentation: Secondary | ICD-10-CM | POA: Diagnosis not present

## 2017-06-08 DIAGNOSIS — M25551 Pain in right hip: Secondary | ICD-10-CM | POA: Diagnosis not present

## 2017-06-08 DIAGNOSIS — M25572 Pain in left ankle and joints of left foot: Secondary | ICD-10-CM | POA: Diagnosis not present

## 2017-07-08 ENCOUNTER — Other Ambulatory Visit: Payer: Self-pay | Admitting: Internal Medicine

## 2017-07-08 DIAGNOSIS — E042 Nontoxic multinodular goiter: Secondary | ICD-10-CM

## 2017-07-19 ENCOUNTER — Ambulatory Visit
Admission: RE | Admit: 2017-07-19 | Discharge: 2017-07-19 | Disposition: A | Discharge status: Home / Self Care | Payer: 59 | Source: Ambulatory Visit | Attending: Internal Medicine | Admitting: Internal Medicine

## 2017-07-19 DIAGNOSIS — E042 Nontoxic multinodular goiter: Secondary | ICD-10-CM | POA: Diagnosis not present

## 2017-09-28 ENCOUNTER — Encounter: Payer: Self-pay | Admitting: Internal Medicine

## 2017-10-26 ENCOUNTER — Other Ambulatory Visit: Payer: Self-pay | Admitting: Orthopedic Surgery

## 2017-10-26 DIAGNOSIS — M8700 Idiopathic aseptic necrosis of unspecified bone: Secondary | ICD-10-CM

## 2017-10-26 DIAGNOSIS — M25551 Pain in right hip: Secondary | ICD-10-CM

## 2017-11-02 DIAGNOSIS — D224 Melanocytic nevi of scalp and neck: Secondary | ICD-10-CM | POA: Diagnosis not present

## 2017-11-02 DIAGNOSIS — D2262 Melanocytic nevi of left upper limb, including shoulder: Secondary | ICD-10-CM | POA: Diagnosis not present

## 2017-11-02 DIAGNOSIS — L821 Other seborrheic keratosis: Secondary | ICD-10-CM | POA: Diagnosis not present

## 2017-11-02 DIAGNOSIS — L820 Inflamed seborrheic keratosis: Secondary | ICD-10-CM | POA: Diagnosis not present

## 2017-11-12 ENCOUNTER — Other Ambulatory Visit: Payer: 59

## 2017-11-14 ENCOUNTER — Encounter (HOSPITAL_COMMUNITY): Payer: Self-pay

## 2017-11-14 ENCOUNTER — Emergency Department (HOSPITAL_COMMUNITY)
Admission: EM | Admit: 2017-11-14 | Discharge: 2017-11-14 | Disposition: A | Discharge status: Home / Self Care | Payer: 59 | Attending: Emergency Medicine | Admitting: Emergency Medicine

## 2017-11-14 ENCOUNTER — Other Ambulatory Visit: Payer: Self-pay

## 2017-11-14 ENCOUNTER — Emergency Department (HOSPITAL_COMMUNITY): Payer: 59

## 2017-11-14 DIAGNOSIS — Y939 Activity, unspecified: Secondary | ICD-10-CM | POA: Diagnosis not present

## 2017-11-14 DIAGNOSIS — S51811A Laceration without foreign body of right forearm, initial encounter: Secondary | ICD-10-CM | POA: Diagnosis not present

## 2017-11-14 DIAGNOSIS — S51851A Open bite of right forearm, initial encounter: Secondary | ICD-10-CM | POA: Diagnosis not present

## 2017-11-14 DIAGNOSIS — Y929 Unspecified place or not applicable: Secondary | ICD-10-CM | POA: Insufficient documentation

## 2017-11-14 DIAGNOSIS — W540XXA Bitten by dog, initial encounter: Secondary | ICD-10-CM | POA: Diagnosis not present

## 2017-11-14 DIAGNOSIS — Y999 Unspecified external cause status: Secondary | ICD-10-CM | POA: Insufficient documentation

## 2017-11-14 MED ORDER — DOXYCYCLINE HYCLATE 100 MG PO TABS
100.0000 mg | ORAL_TABLET | Freq: Once | ORAL | Status: AC
  Administered 2017-11-14: 100 mg via ORAL
  Filled 2017-11-14: qty 1

## 2017-11-14 MED ORDER — BACITRACIN ZINC 500 UNIT/GM EX OINT
TOPICAL_OINTMENT | Freq: Once | CUTANEOUS | Status: AC
  Administered 2017-11-14: 1 via TOPICAL
  Filled 2017-11-14: qty 0.9

## 2017-11-14 MED ORDER — DOXYCYCLINE HYCLATE 100 MG PO CAPS: 100.0000 mg | ORAL_CAPSULE | Freq: Two times a day (BID) | ORAL | 0 refills | Status: AC

## 2017-11-14 MED ORDER — LIDOCAINE HCL (PF) 1 % IJ SOLN
10.0000 mL | Freq: Once | INTRAMUSCULAR | Status: AC
  Administered 2017-11-14: 10 mL via INTRADERMAL
  Filled 2017-11-14: qty 30

## 2017-11-14 MED ORDER — DIAZEPAM 5 MG PO TABS
5.0000 mg | ORAL_TABLET | Freq: Once | ORAL | Status: AC
  Administered 2017-11-14: 5 mg via ORAL
  Filled 2017-11-14: qty 1

## 2017-11-14 MED ORDER — ACETAMINOPHEN 500 MG PO TABS
1000.0000 mg | ORAL_TABLET | Freq: Once | ORAL | Status: AC
  Administered 2017-11-14: 1000 mg via ORAL
  Filled 2017-11-14: qty 2

## 2017-11-14 MED ORDER — HYDROCODONE-ACETAMINOPHEN 5-325 MG PO TABS: 1.0000 | ORAL_TABLET | Freq: Four times a day (QID) | ORAL | 0 refills | Status: DC | PRN

## 2017-11-14 NOTE — ED Provider Notes (Signed)
[facility] DEPT Provider Note   CSN: [id number] Arrival date & time: 11/14/17  1510     History   Chief Complaint Chief Complaint  Patient presents with  . Animal Bite    HPI Heather Chandler is a 57 y.o. female who presents for evaluation of dog bite to right arm that occurred at approximately 1430 this afternoon. Patient reports that she was bitten by her friends dog on her arm. Friend is with her and states that dog's vaccines are up to date. Patient reports that her tetanus shot is up to date. Patient denies any numbness, weakness.   The history is provided by the patient.    Past Medical History:  Diagnosis Date  . Anal fistula    HAVING BLEEDING AT TIMES AND UNCOMFORTABLE   . Complication of anesthesia    SLOW TO WAKE UP - EVEN AFTER COLONOSCOPY - I GO HOME AND SLEEP FOR HOURS  . History of kidney stones    HAD LITHO - BUT IT DID NOT CRUSH THE STONE - STILL HAVE THE STONE  . Multinodular goiter (nontoxic)    PT STATES HER THROID LEVELS WERE OK WHEN CHECKED IN OCT 2014    Patient Active Problem List   Diagnosis Date Noted  . Fistula-in-ano-Seton placed Dec 2014 03/15/2013  . Rectocele 03/15/2013    Past Surgical History:  Procedure Laterality Date  . BREAST SURGERY     enhancement Sx  . EVALUATION UNDER ANESTHESIA WITH ANAL FISTULECTOMY N/A 03/01/2013   Procedure: EXAM UNDER ANESTHESIA WITH, ANAL BIOPSY, SETON PLACEMENT;  Surgeon: Pedro Earls, MD;  Location: [facility];  Service: General;  Laterality: N/A;  . LAPAROSCOPY    . RECTAL ULTRASOUND N/A 04/19/2013   Procedure: anal ULTRASOUND;  Surgeon: Leighton Ruff, MD;  Location: [facility];  Service: Endoscopy;  Laterality: N/A;  pt tol procedure well...segment of anal muscle with predominant scar tissue...discussion by Dr. Marcello Moores with pt in procedure room prior to leaving ...  . REVISION OF SCAR    . TONSILLECTOMY       OB History   None      Home Medications    Prior to  Admission medications   Medication Sig Start Date End Date Taking? Authorizing Provider  diazepam (VALIUM) 5 MG tablet Take 5 mg by mouth daily as needed (sleep).  04/25/12  Yes [provider]  ibandronate (BONIVA) 150 MG tablet Take 150 mg by mouth every 30 (thirty) days. Take in the morning with a full glass of water, on an empty stomach, and do not take anything else by mouth or lie down for the next 30 min.   Yes [provider]  acetaminophen (TYLENOL) 325 MG tablet Take 650 mg by mouth every 6 (six) hours as needed.    [provider]  doxycycline (VIBRAMYCIN) 100 MG capsule Take 1 capsule (100 mg total) by mouth 2 (two) times daily for 7 days. 11/14/17 11/21/17  Volanda Napoleon, PA-C  HYDROcodone-acetaminophen (NORCO/VICODIN) 5-325 MG tablet Take 1-2 tablets by mouth every 6 (six) hours as needed. 11/14/17   Volanda Napoleon, PA-C    Family History Family History  Problem Relation Age of Onset  . Cancer Mother        breast & uterian  . Cancer Father        leukemia  . Cancer Maternal Aunt        breast  . Cancer Maternal Grandmother  breast  . Cancer Maternal Grandfather        lung    Social History Social History   Tobacco Use  . Smoking status: Never Smoker  . Smokeless tobacco: Never Used  Substance Use Topics  . Alcohol use: Yes    Comment: COUPLE GLASSES WINE OR A BEER PER WEEK  . Drug use: No     Allergies   Ceclor [cefaclor]; Codeine; Penicillins; Flagyl [metronidazole]; and Morphine and related   Review of Systems Review of Systems  Skin: Positive for wound.  Neurological: Negative for weakness and numbness.  All other systems reviewed and are negative.    Physical Exam Updated Vital Signs BP (!) 137/58 (BP Location: Left Arm)   Pulse 96   Temp 97.9 F (36.6 C) (Oral)   Resp 18   SpO2 100%   Physical Exam  Constitutional: She appears well-developed and well-nourished.  HENT:  Head: Normocephalic and  atraumatic.  Eyes: Conjunctivae and EOM are normal. Right eye exhibits no discharge. Left eye exhibits no discharge. No scleral icterus.  Cardiovascular:  Pulses:      Radial pulses are 2+ on the right side, and 2+ on the left side.  Pulmonary/Chest: Effort normal.  Musculoskeletal:  Flexion/extension of right elbow intact with any difficulty.  Flexion/extension of right wrist intact without any difficulty.  Patient can move all 5 digits of right hand without any difficulty.  Flexion/extension of all 5 digits intact without any difficulty.  Patient can easily make a fist.  Equal grip strength.  Neurological: She is alert.  Sensation intact along major nerve distributions of BUE  Skin: Skin is warm and dry. Capillary refill takes less than 2 seconds.  Good distal cap refill. RUE is not dusky in appearance or cool to touch. V shaped laceration that is 5 cm on each side noted to the posterior aspect of the mid right mid forearm   Psychiatric: She has a normal mood and affect. Her speech is normal and behavior is normal.  Nursing note and vitals reviewed.          ED Treatments / Results  Labs (all labs ordered are listed, but only abnormal results are displayed) Labs Reviewed - No data to display  EKG None  Radiology Dg Forearm Right  Result Date: 11/14/2017 CLINICAL DATA:  Dog bite to the RIGHT forearm earlier today. Initial encounter. EXAM: RIGHT FOREARM - 2 VIEW COMPARISON:  None. FINDINGS: Significant soft tissue injury involving the mid and distal forearm. No evidence of acute fracture involving the radius or ulna. No intrinsic osseous abnormality. Visualized wrist joint and elbow joint intact. IMPRESSION: No osseous abnormality. Electronically Signed   By: Evangeline Dakin M.D.   On: 11/14/2017 16:23    Procedures .Marland KitchenLaceration Repair Date/Time: 11/14/2017 5:19 PM Performed by: Volanda Napoleon, PA-C Authorized by: Volanda Napoleon, PA-C   Consent:    Consent obtained:   Verbal   Consent given by:  Patient   Risks discussed:  Infection, pain, poor cosmetic result and poor wound healing Anesthesia (see MAR for exact dosages):    Anesthesia method:  Local infiltration   Local anesthetic:  Lidocaine 1% w/o epi Laceration details:    Location:  Shoulder/arm   Shoulder/arm location:  R lower arm   Length (cm):  6 (V shaped laceration that is 6 cm in length on either side) Repair type:    Repair type:  Intermediate Pre-procedure details:    Preparation:  Patient was prepped and draped  in usual sterile fashion Exploration:    Hemostasis achieved with:  Direct pressure   Wound exploration: wound explored through full range of motion     Wound extent: no muscle damage noted, no nerve damage noted and no tendon damage noted   Treatment:    Area cleansed with:  Betadine   Amount of cleaning:  Extensive   Irrigation solution:  Sterile saline   Irrigation method:  Syringe   Visualized foreign bodies/material removed: no   Skin repair:    Repair method:  Sutures   Suture size:  4-0   Suture material:  Nylon   Suture technique:  Simple interrupted   Number of sutures:  7 Approximation:    Approximation:  Loose Post-procedure details:    Dressing:  Antibiotic ointment and non-adherent dressing   Patient tolerance of procedure:  Tolerated well, no immediate complications Comments:     The wound was anesthetized, it was thoroughly and extensively irrigated with sterile saline.  On my evaluation, there.  No evidence of muscle injury.  Patient could move all 5 digits without any difficulty.  No evidence of tendon injury.  The wound was closed with several loose sutures and allow for continuous drainage to prevent wound infection.   (including critical care time)  Medications Ordered in ED Medications  bacitracin ointment (has no administration in time range)  doxycycline (VIBRA-TABS) tablet 100 mg (has no administration in time range)  lidocaine (PF)  (XYLOCAINE) 1 % injection 10 mL (10 mLs Intradermal Given 11/14/17 1608)  diazepam (VALIUM) tablet 5 mg (5 mg Oral Given 11/14/17 1608)  acetaminophen (TYLENOL) tablet 1,000 mg (1,000 mg Oral Given 11/14/17 1608)     Initial Impression / Assessment and Plan / ED Course  I have reviewed the triage vital signs and the nursing notes.  Pertinent labs & imaging results that were available during my care of the patient were reviewed by me and considered in my medical decision making (see chart for details).     57 y.o. female who presents for evaluation of dog bite to right forearm that occurred approxi-2:30 PM this afternoon.  She reports the dog is up-to-date on vaccines.  Her last tetanus shot was in 1 year.  Patient denies any numbness/weakness. Patient is afebrile, non-toxic appearing, sitting comfortably on examination table. Vital signs reviewed and stable.  Patient is neurovascularly intact.  On exam, she has a V-shaped laceration noted to the posterior aspect of her right forearm that is 6 cm on each side.  Does exposed subcu change tissue.  Patient with full range of motion of hand without any difficulty.  No evidence of flexor or extensor tendon injury.  Plan for wound care.  Given the extensiveness of the wound, will plan for sutures to help with healing but also due to continue drain to prevent any wound infection.  XR reviewed no evidence of acute fracture or foreign body.  Laceration repaired as documented above.  Patient tolerated procedure well without any difficulty.  Patient with known allergy to penicillins.  Will plan to start her on doxycycline for mention of wound infection.  Instructed patient on wound care precautions. Patient had ample opportunity for questions and discussion. All patient's questions were answered with full understanding. Strict return precautions discussed. Patient expresses understanding and agreement to plan.    Final Clinical Impressions(s) / ED Diagnoses    Final diagnoses:  Dog bite, initial encounter    ED Discharge Orders  Ordered    doxycycline (VIBRAMYCIN) 100 MG capsule  2 times daily     11/14/17 1723    HYDROcodone-acetaminophen (NORCO/VICODIN) 5-325 MG tablet  Every 6 hours PRN     11/14/17 1723           Volanda Napoleon, PA-C 11/14/17 2111    Blanchie Dessert, MD 11/15/17 2325

## 2017-11-14 NOTE — ED Notes (Signed)
Bed: WA12 Expected date:  Expected time:  Means of arrival:  Comments: 

## 2017-11-14 NOTE — Discharge Instructions (Signed)
Keep the wound clean and dry for the first 24 hours. After that you may gently clean the wound with soap and water. Make sure to pat dry the wound before covering it with any dressing. You can use topical antibiotic ointment and bandage. Ice and elevate for pain relief.   You can take Tylenol or Ibuprofen as directed for pain. You can alternate Tylenol and Ibuprofen every 4 hours for additional pain relief. You can take pain medication for severe or breakthrough pain.  Take antibiotics as directed. Please take all of your antibiotics until finished.  Return to the Emergency Department, your primary care doctor, or the [facility] [place] in 7-10 days for suture removal.   Monitor closely for any signs of infection. Return to the Emergency Department for any worsening redness/swelling of the area that begins to spread, drainage from the site, worsening pain, fever or any other worsening or concerning symptoms.

## 2017-11-14 NOTE — ED Triage Notes (Signed)
Pt reports that she was bitten on the R forearm by her friends dog. He is with her and reports that the dog is up to date on shots. She reports heavy bleeding on scene, but immediately wrapped it and bleeding is controlled at this time. A&Ox4. Ambulatory.

## 2017-11-18 ENCOUNTER — Other Ambulatory Visit: Payer: 59

## 2017-11-22 DIAGNOSIS — W540XXA Bitten by dog, initial encounter: Secondary | ICD-10-CM | POA: Diagnosis not present

## 2017-11-22 DIAGNOSIS — R202 Paresthesia of skin: Secondary | ICD-10-CM | POA: Diagnosis not present

## 2017-11-23 DIAGNOSIS — S51851A Open bite of right forearm, initial encounter: Secondary | ICD-10-CM | POA: Diagnosis not present

## 2017-11-23 DIAGNOSIS — W540XXA Bitten by dog, initial encounter: Secondary | ICD-10-CM | POA: Diagnosis not present

## 2017-11-25 ENCOUNTER — Encounter (HOSPITAL_COMMUNITY): Payer: Self-pay | Admitting: Emergency Medicine

## 2017-11-26 DIAGNOSIS — S51851D Open bite of right forearm, subsequent encounter: Secondary | ICD-10-CM | POA: Diagnosis not present

## 2017-11-26 DIAGNOSIS — W540XXD Bitten by dog, subsequent encounter: Secondary | ICD-10-CM | POA: Diagnosis not present

## 2017-11-30 DIAGNOSIS — Z4789 Encounter for other orthopedic aftercare: Secondary | ICD-10-CM | POA: Diagnosis not present

## 2017-12-08 DIAGNOSIS — S51851D Open bite of right forearm, subsequent encounter: Secondary | ICD-10-CM | POA: Diagnosis not present

## 2017-12-22 DIAGNOSIS — S51851D Open bite of right forearm, subsequent encounter: Secondary | ICD-10-CM | POA: Diagnosis not present

## 2017-12-22 DIAGNOSIS — L910 Hypertrophic scar: Secondary | ICD-10-CM | POA: Diagnosis not present

## 2017-12-22 DIAGNOSIS — W540XXD Bitten by dog, subsequent encounter: Secondary | ICD-10-CM | POA: Diagnosis not present

## 2017-12-29 DIAGNOSIS — M79631 Pain in right forearm: Secondary | ICD-10-CM | POA: Diagnosis not present

## 2017-12-29 DIAGNOSIS — S51851D Open bite of right forearm, subsequent encounter: Secondary | ICD-10-CM | POA: Diagnosis not present

## 2017-12-29 DIAGNOSIS — L910 Hypertrophic scar: Secondary | ICD-10-CM | POA: Diagnosis not present

## 2018-01-19 DIAGNOSIS — L910 Hypertrophic scar: Secondary | ICD-10-CM | POA: Diagnosis not present

## 2018-01-19 DIAGNOSIS — M25631 Stiffness of right wrist, not elsewhere classified: Secondary | ICD-10-CM | POA: Diagnosis not present

## 2018-01-19 DIAGNOSIS — S51851D Open bite of right forearm, subsequent encounter: Secondary | ICD-10-CM | POA: Diagnosis not present

## 2018-01-28 ENCOUNTER — Ambulatory Visit
Admission: RE | Admit: 2018-01-28 | Discharge: 2018-01-28 | Disposition: A | Discharge status: Home / Self Care | Payer: 59 | Source: Ambulatory Visit | Attending: Orthopedic Surgery | Admitting: Orthopedic Surgery

## 2018-01-28 DIAGNOSIS — M8700 Idiopathic aseptic necrosis of unspecified bone: Secondary | ICD-10-CM

## 2018-01-28 DIAGNOSIS — M25551 Pain in right hip: Secondary | ICD-10-CM

## 2018-01-28 DIAGNOSIS — S73191A Other sprain of right hip, initial encounter: Secondary | ICD-10-CM | POA: Diagnosis not present

## 2018-01-28 MED ORDER — IOPAMIDOL (ISOVUE-M 200) INJECTION 41%
13.0000 mL | Freq: Once | INTRAMUSCULAR | Status: AC
  Administered 2018-01-28: 13 mL via INTRA_ARTICULAR

## 2018-02-02 DIAGNOSIS — S51851D Open bite of right forearm, subsequent encounter: Secondary | ICD-10-CM | POA: Diagnosis not present

## 2018-02-02 DIAGNOSIS — M25631 Stiffness of right wrist, not elsewhere classified: Secondary | ICD-10-CM | POA: Diagnosis not present

## 2018-02-02 DIAGNOSIS — L910 Hypertrophic scar: Secondary | ICD-10-CM | POA: Diagnosis not present

## 2018-02-04 DIAGNOSIS — Z124 Encounter for screening for malignant neoplasm of cervix: Secondary | ICD-10-CM | POA: Diagnosis not present

## 2018-02-04 DIAGNOSIS — Z01419 Encounter for gynecological examination (general) (routine) without abnormal findings: Secondary | ICD-10-CM | POA: Diagnosis not present

## 2018-02-09 DIAGNOSIS — Z0000 Encounter for general adult medical examination without abnormal findings: Secondary | ICD-10-CM | POA: Diagnosis not present

## 2018-02-09 DIAGNOSIS — E559 Vitamin D deficiency, unspecified: Secondary | ICD-10-CM | POA: Diagnosis not present

## 2018-02-09 DIAGNOSIS — M810 Age-related osteoporosis without current pathological fracture: Secondary | ICD-10-CM | POA: Diagnosis not present

## 2018-02-14 DIAGNOSIS — E559 Vitamin D deficiency, unspecified: Secondary | ICD-10-CM | POA: Diagnosis not present

## 2018-02-14 DIAGNOSIS — Z0000 Encounter for general adult medical examination without abnormal findings: Secondary | ICD-10-CM | POA: Diagnosis not present

## 2018-02-14 DIAGNOSIS — E042 Nontoxic multinodular goiter: Secondary | ICD-10-CM | POA: Diagnosis not present

## 2018-02-14 DIAGNOSIS — M810 Age-related osteoporosis without current pathological fracture: Secondary | ICD-10-CM | POA: Diagnosis not present

## 2018-02-17 DIAGNOSIS — M25551 Pain in right hip: Secondary | ICD-10-CM | POA: Diagnosis not present

## 2018-03-02 DIAGNOSIS — L910 Hypertrophic scar: Secondary | ICD-10-CM | POA: Diagnosis not present

## 2018-03-02 DIAGNOSIS — S51851D Open bite of right forearm, subsequent encounter: Secondary | ICD-10-CM | POA: Diagnosis not present

## 2018-03-02 DIAGNOSIS — M25631 Stiffness of right wrist, not elsewhere classified: Secondary | ICD-10-CM | POA: Diagnosis not present

## 2018-03-05 DIAGNOSIS — Z1231 Encounter for screening mammogram for malignant neoplasm of breast: Secondary | ICD-10-CM | POA: Diagnosis not present

## 2018-03-07 DIAGNOSIS — M25572 Pain in left ankle and joints of left foot: Secondary | ICD-10-CM | POA: Diagnosis not present

## 2018-03-08 DIAGNOSIS — M25551 Pain in right hip: Secondary | ICD-10-CM | POA: Diagnosis not present

## 2018-03-08 DIAGNOSIS — M4120 Other idiopathic scoliosis, site unspecified: Secondary | ICD-10-CM | POA: Diagnosis not present

## 2018-06-06 ENCOUNTER — Telehealth: Payer: Self-pay | Admitting: Gastroenterology

## 2018-06-06 NOTE — Telephone Encounter (Signed)
Hi Dr. Bryan Lemma, we have received a referral from Pt's PCP for a repeat colonoscopy, Pt had previous colonoscopy in 2014 at [facility], we received her records and they will be sent to you for review. Please advise on scheduling. Thank you.

## 2018-06-10 NOTE — Telephone Encounter (Signed)
Dr.Cirigliano reviewed patient's records and accepted for patient to be seen prior to setting up repeat colonoscopy. Patient scheduled for virtual office visit with Dr.Cirigliano 06/14/2018.

## 2018-06-14 ENCOUNTER — Telehealth (INDEPENDENT_AMBULATORY_CARE_PROVIDER_SITE_OTHER): Payer: 59 | Admitting: Gastroenterology

## 2018-06-14 ENCOUNTER — Other Ambulatory Visit: Payer: Self-pay

## 2018-06-14 ENCOUNTER — Encounter: Payer: Self-pay | Admitting: Gastroenterology

## 2018-06-14 DIAGNOSIS — Z1211 Encounter for screening for malignant neoplasm of colon: Secondary | ICD-10-CM

## 2018-06-14 DIAGNOSIS — K603 Anal fistula: Secondary | ICD-10-CM | POA: Diagnosis not present

## 2018-06-14 DIAGNOSIS — K648 Other hemorrhoids: Secondary | ICD-10-CM

## 2018-06-14 DIAGNOSIS — Z8371 Family history of colonic polyps: Secondary | ICD-10-CM

## 2018-06-14 NOTE — Patient Instructions (Signed)
To help prevent the possible spread of infection to our patients, communities, and staff; we will be implementing the following measures:  As of now we are not allowing any visitors/family members to accompany you to any upcoming appointments with [facility]. If you have any concerns about this please contact our office to discuss prior to the appointment.   Please call our office at [phone] to set up your Colonoscopy when the restrictions are lifted for Covid-19.  It was a pleasure to see you today!  Vito Cirigliano, D.O.

## 2018-06-14 NOTE — Progress Notes (Signed)
Chief Complaint: CRC screening, Family hx of polyps,  Internal hemorrhoids, Hx of perianal fistula   Referring Provider:     Marton Redwood, MD   HPI:    Due to current restrictions/limitations of in-office visits due to the COVID-19 pandemic, this scheduled clinical appointment was converted to a telehealth virtual consultation using WebEx/Zoom.  -Time of medical discussion: 25 minutes -The patient did consent to this virtual visit and is aware of possible charges through their insurance for this visit.  -Names of all parties present: Heather Chandler (patient), Gerrit Heck, DO, [facility] (physician) -Patient location: Home -Physician location: Office  Heather Chandler is a 58 y.o. female w a hx of hemorrhoids, anal fistula diagnosed 2014,, chronic constipation, referred to the [facility] for evaluation of ongoing CRC screening. Last colonoscopy was completed by Dr. Oletta Lamas at [place] in 04/2012 and n/f internal hemorrhoids, with recommendation to repeat in 5 year d/t family history of advanced polyps.  She was additionally diagnosed with perianal fistula at the time of that colonoscopy, with MRI pelvis in 05/2012 demonstrating grade 3/4 perianal fistula, requiring seton placement in 01/2013 by Dr. Marcello Moores.  Seton spontaneously removed in 2015. Fistula will drain periodically since then, without infection, fever. Currently draining for last couple days but otherwise not bothersome.  No associated change in bowel habits, hematochezia, melena.    Hx of hemorrhoids with intermittent scant hematochezia. Otherwise no hx of thrombosed external hemorrhoids.   FHx n/f brothers x2 with advanced colon polyps. Maternal Grandmother with ?Crohns Disease.   Most recent labs from [facility] from 01/2017 n/f WBC 2.9 with normal Hgb/Hct and normal CMP.  Endoscopic Hx: - Colonoscopy (04/2012, Dr. Oletta Lamas): 0.5 cm nodule noted 2-3 cm from anus on left buttocks, without anal fissure,  internal hemorhoids. Normal colon. Recommended repeat in 5 years d/t FHx - Colonoscopy (2000, Dr. Oletta Lamas): Internal hemorrhoids, o/w normal.  Past medical history, past surgical history, social history, family history, medications, and allergies reviewed in the chart and with patient over the WebEx.  Past Medical History:  Diagnosis Date  . Anal fistula    HAVING BLEEDING AT TIMES AND UNCOMFORTABLE   . Complication of anesthesia    SLOW TO WAKE UP - EVEN AFTER COLONOSCOPY - I GO HOME AND SLEEP FOR HOURS  . History of kidney stones    HAD LITHO - BUT IT DID NOT CRUSH THE STONE - STILL HAVE THE STONE  . Multinodular goiter (nontoxic)    PT STATES HER THROID LEVELS WERE OK WHEN CHECKED IN OCT 2014     Past Surgical History:  Procedure Laterality Date  . BREAST SURGERY     enhancement Sx  . EVALUATION UNDER ANESTHESIA WITH ANAL FISTULECTOMY N/A 03/01/2013   Procedure: EXAM UNDER ANESTHESIA WITH, ANAL BIOPSY, SETON PLACEMENT;  Surgeon: Pedro Earls, MD;  Location: [facility];  Service: General;  Laterality: N/A;  . LAPAROSCOPY    . RECTAL ULTRASOUND N/A 04/19/2013   Procedure: anal ULTRASOUND;  Surgeon: Leighton Ruff, MD;  Location: [facility];  Service: Endoscopy;  Laterality: N/A;  pt tol procedure well...segment of anal muscle with predominant scar tissue...discussion by Dr. Marcello Moores with pt in procedure room prior to leaving ...  . REVISION OF SCAR    . TONSILLECTOMY     Family History  Problem Relation Age of Onset  . Cancer Mother        breast & uterian  .  Cancer Father        leukemia  . Cancer Maternal Aunt        breast  . Cancer Maternal Grandmother        breast  . Cancer Maternal Grandfather        lung   Social History   Tobacco Use  . Smoking status: Never Smoker  . Smokeless tobacco: Never Used  Substance Use Topics  . Alcohol use: Yes    Alcohol/week: 5.0 standard drinks    Types: 5 Standard drinks or equivalent per week    Comment: COUPLE GLASSES WINE  OR A BEER PER WEEK  . Drug use: No   Current Outpatient Medications  Medication Sig Dispense Refill  . acetaminophen (TYLENOL) 325 MG tablet Take 650 mg by mouth every 6 (six) hours as needed.    . diazepam (VALIUM) 5 MG tablet Take 5 mg by mouth daily as needed (sleep).     . ibandronate (BONIVA) 150 MG tablet Take 150 mg by mouth every 30 (thirty) days. Take in the morning with a full glass of water, on an empty stomach, and do not take anything else by mouth or lie down for the next 30 min.    . Vitamin D, Cholecalciferol, 50 MCG (2000 UT) CAPS Take 2,000 Units by mouth daily.     No current facility-administered medications for this visit.    Allergies  Allergen Reactions  . Ceclor [Cefaclor] Rash  . Codeine      REACTION AS A CHILD -  FAINTED --DID NOT HAVE TO GO TO ER  . Penicillins Rash    Has patient had a PCN reaction causing immediate rash, facial/tongue/throat swelling, SOB or lightheadedness with hypotension: yes Has patient had a PCN reaction causing severe rash involving mucus membranes or skin necrosis:yes  Has patient had a PCN reaction that required hospitalization: no Has patient had a PCN reaction occurring within the last 10 years: yes If all of the above answers are "NO", then may proceed with Cephalosporin use.   . Flagyl [Metronidazole]      CAUSED DIARRHEA,   . Morphine And Related     GI upset     Review of Systems: All systems reviewed and negative except where noted in HPI.     Physical Exam:    Physical exam not completed due to the nature of this telehealth communication.  Patient was otherwise alert and oriented and well communicative.   ASSESSMENT AND PLAN;   1) CRC screening 2) Family history of colon polyps - Family history notable for brothers x2 with advanced colon polyps.  Able to resect these via colonoscopy per patient.  Her last colonoscopy 2014 was otherwise normal, with recommendation repeat in 5 years due to family history.   Discussed current societal guidelines related to family history of polyps and colon cancer which should convey increased risk to the patient.  Given family history of 2 first-degree relatives with advanced colon polyps along with previous endoscopist's recommendation for 5-year interval, along with increased patient concerns and intermittent lower GI symptoms (hematochezia lower), plan for repeat colonoscopy now -Schedule for repeat colonoscopy when current endoscopy restrictions related to the COVID-19 pandemic are lifted - Recommendations for ongoing screening intervals pending repeat colonoscopy findings  3) Internal hemorrhoids: - Continue high-fiber diet - Evaluate size/grade of hemorrhoids at time of colonoscopy, and if amenable to hemorrhoid band ligation, patient will consider setting this up depending on severity of symptoms in the future  4)  History of perianal fistula: Perianal fistula diagnosed 2014 requiring seton placement.  Delphina Cahill has since been spontaneously removed 2015, with intermittent draining from the same fistula site.  Has not followed up with Dr. Marcello Moores in quite some time.  Currently non-bothersome although will drain periodically. -Evaluate for mucosal/luminal active inflammatory changes at time of colonoscopy as above -Last colonoscopy did not mention intubation of the ileum.  We will plan to do so on this repeat colonoscopy to evaluate for subtle changes of IBD - Following colonoscopy, likely plan to refer back to Dr. Marcello Moores for ongoing evaluation  The indications, risks, and benefits of colonoscopy were explained to the patient in detail. Risks include but are not limited to bleeding, perforation, adverse reaction to medications, and cardiopulmonary compromise. Sequelae include but are not limited to the possibility of surgery, hospitalization, and mortality. The patient verbalized understanding and wished to proceed. All questions answered, referred to the scheduler and  bowel prep ordered. Further recommendations pending results of the exam.     Lavena Bullion, DO, FACG  06/14/2018, 2:25 PM   Marton Redwood, MD

## 2018-07-12 DIAGNOSIS — L814 Other melanin hyperpigmentation: Secondary | ICD-10-CM | POA: Diagnosis not present

## 2018-07-12 DIAGNOSIS — D225 Melanocytic nevi of trunk: Secondary | ICD-10-CM | POA: Diagnosis not present

## 2018-07-12 DIAGNOSIS — L570 Actinic keratosis: Secondary | ICD-10-CM | POA: Diagnosis not present

## 2018-07-12 DIAGNOSIS — D485 Neoplasm of uncertain behavior of skin: Secondary | ICD-10-CM | POA: Diagnosis not present

## 2018-07-12 DIAGNOSIS — D224 Melanocytic nevi of scalp and neck: Secondary | ICD-10-CM | POA: Diagnosis not present

## 2018-07-19 ENCOUNTER — Other Ambulatory Visit: Payer: Self-pay | Admitting: Internal Medicine

## 2018-07-19 DIAGNOSIS — E049 Nontoxic goiter, unspecified: Secondary | ICD-10-CM

## 2018-07-21 ENCOUNTER — Telehealth: Payer: Self-pay | Admitting: Gastroenterology

## 2018-07-21 NOTE — Telephone Encounter (Signed)
The pt has been scheduled for previsit and colon with Dr C.  She will call with any further concerns prior to appts.

## 2018-07-21 NOTE — Telephone Encounter (Signed)
Patient states she is having IBS and feels like its getting a little worse she also thinks it could be due to stress  And COVID. Would like to know what we recommend while we open up to schedule her colonoscopy's

## 2018-08-02 ENCOUNTER — Other Ambulatory Visit: Payer: Self-pay

## 2018-08-02 ENCOUNTER — Ambulatory Visit: Payer: 59

## 2018-08-02 VITALS — Ht 63.75 in | Wt 110.0 lb

## 2018-08-02 DIAGNOSIS — Z8371 Family history of colonic polyps: Secondary | ICD-10-CM

## 2018-08-02 MED ORDER — NA SULFATE-K SULFATE-MG SULF 17.5-3.13-1.6 GM/177ML PO SOLN: 1.0000 | Freq: Once | ORAL | 0 refills | Status: AC

## 2018-08-02 NOTE — Progress Notes (Signed)
No egg or soy allergy known to patient  No issues with past sedation with any surgeries  or procedures, no intubation problems  No diet pills per patient No home 02 use per patient  No blood thinners per patient  Pt denies issues with constipation  No A fib or A flutter  EMMI video sent to pt's e mail , pt declined    

## 2018-08-03 ENCOUNTER — Encounter: Payer: Self-pay | Admitting: Gastroenterology

## 2018-08-08 ENCOUNTER — Telehealth: Payer: Self-pay | Admitting: *Deleted

## 2018-08-08 NOTE — Telephone Encounter (Signed)
Covid-19 travel screening questions  Have you traveled in the last 14 days? no If yes where?  Do you now or have you had a fever in the last 14 days? no  Do you have any respiratory symptoms of shortness of breath or cough now or in the last 14 days? no  Do you have any family members or close contacts with diagnosed or suspected Covid-19? No  Pt is aware that care partner will be waiting in the car during her procedure.  She will wear a mask into the building

## 2018-08-09 ENCOUNTER — Other Ambulatory Visit: Payer: Self-pay

## 2018-08-09 ENCOUNTER — Ambulatory Visit (AMBULATORY_SURGERY_CENTER): Payer: 59 | Admitting: Gastroenterology

## 2018-08-09 ENCOUNTER — Encounter: Payer: Self-pay | Admitting: Certified Registered Nurse Anesthetist

## 2018-08-09 VITALS — BP 118/58 | HR 80 | Temp 98.4°F | Resp 17 | Ht 63.0 in | Wt 110.0 lb

## 2018-08-09 DIAGNOSIS — K603 Anal fistula: Secondary | ICD-10-CM

## 2018-08-09 DIAGNOSIS — K635 Polyp of colon: Secondary | ICD-10-CM

## 2018-08-09 DIAGNOSIS — D122 Benign neoplasm of ascending colon: Secondary | ICD-10-CM

## 2018-08-09 DIAGNOSIS — K648 Other hemorrhoids: Secondary | ICD-10-CM

## 2018-08-09 DIAGNOSIS — K640 First degree hemorrhoids: Secondary | ICD-10-CM

## 2018-08-09 DIAGNOSIS — Z1211 Encounter for screening for malignant neoplasm of colon: Secondary | ICD-10-CM | POA: Diagnosis not present

## 2018-08-09 DIAGNOSIS — Z8371 Family history of colonic polyps: Secondary | ICD-10-CM

## 2018-08-09 DIAGNOSIS — K5730 Diverticulosis of large intestine without perforation or abscess without bleeding: Secondary | ICD-10-CM | POA: Diagnosis not present

## 2018-08-09 HISTORY — PX: COLONOSCOPY: SHX174

## 2018-08-09 MED ORDER — SODIUM CHLORIDE 0.9 % IV SOLN: 500.0000 mL | Freq: Once | INTRAVENOUS | Status: DC

## 2018-08-09 NOTE — Progress Notes (Signed)
Pt c/o abdominal cramping.  She said she has a hard time waking up and also passing flatus.  I encouraged pt to open her eyes, to roll to her right side laterly and to bear down and pass flatus.  Pt said she could not pass gas.  I gave her 2 levsin sl.  I encouraged her again to try to bear down.  She said she was passing flatus.  Pt positioned in high fowlers and she said she was ready to go home.  She told to start with a small, light meal and lay down and get a good nap.  To continue to bear down and pass the flatus.  maw

## 2018-08-09 NOTE — Progress Notes (Signed)
Called to room to assist during endoscopic procedure.  Patient ID and intended procedure confirmed with present staff. Received instructions for my participation in the procedure from the performing physician.  

## 2018-08-09 NOTE — Op Note (Signed)
[facility] Patient Name: Heather Chandler Procedure Date: 08/09/2018 10:35 AM MRN: [id number] Endoscopist: Gerrit Heck , MD Age: 58 Referring MD:  Date of Birth: Sep 23, 1960 Gender: Female Account #: [id number] Procedure:                Colonoscopy Indications:              Hematochezia, Diarrhea, History of perianal fistula                            requiring prior seton placement in 2014. Family                            history of brothers x2 with advanced polyps. Medicines:                Monitored Anesthesia Care Procedure:                Pre-Anesthesia Assessment:                           - Prior to the procedure, a History and Physical                            was performed, and patient medications and                            allergies were reviewed. The patient's tolerance of                            previous anesthesia was also reviewed. The risks                            and benefits of the procedure and the sedation                            options and risks were discussed with the patient.                            All questions were answered, and informed consent                            was obtained. Prior Anticoagulants: The patient has                            taken no previous anticoagulant or antiplatelet                            agents. ASA Grade Assessment: II - A patient with                            mild systemic disease. After reviewing the risks                            and benefits, the patient was deemed in  satisfactory condition to undergo the procedure.                           After obtaining informed consent, the colonoscope                            was passed under direct vision. Throughout the                            procedure, the patient's blood pressure, pulse, and                            oxygen saturations were monitored continuously. The                            Colonoscope was  introduced through the anus and                            advanced to the the cecum, identified by                            appendiceal orifice and ileocecal valve. The                            colonoscopy was performed without difficulty. The                            patient tolerated the procedure well. The quality                            of the bowel preparation was adequate. The                            ileocecal valve, appendiceal orifice, and rectum                            were photographed. Scope In: 10:43:13 AM Scope Out: 11:13:56 AM Scope Withdrawal Time: 0 hours 26 minutes 59 seconds  Total Procedure Duration: 0 hours 30 minutes 43 seconds  Findings:                 The perianal exam findings include scar from                            perianal fistula site without active drainage.                           A localized area of nodular mucosa was found in the                            cecum, immediately adjacent to the AO. Mucosal                            biopsies were taken with a cold forceps for  histology. Estimated blood loss was minimal.                           A 15 mm area of polypoid mucosa was found in the                            ascending colon. The polyp was flat. The edges were                            marked with cautery, 1-2 mm from the periphery of                            the polypoid mucosa. Area was successfully injected                            with 3 mL saline in prepration for a lift                            polypectomy. There wsa rapid loss of the lift, and                            given the size of the polyp and need to use Eleview                            or Orise, polypectomy was not performed in favor of                            completing the procedure in the hospital for lift                            and resection. Area on the contralateral wall was                            tattooed  with an injection of 2 mL of Spot (carbon                            black). A small surface mucosal biopsy was obtained                            for mucosal confirmation of adenomatous tissue.                           A single small-mouthed diverticulum was found in                            the sigmoid colon.                           Non-bleeding internal hemorrhoids were found during                            retroflexion. The hemorrhoids were small.  Normal mucosa was found in the recto-sigmoid colon,                            in the sigmoid colon, in the descending colon and                            in the transverse colon. Biopsies for histology                            were taken with a cold forceps from the left colon                            for evaluation of microscopic colitis. There was a                            small, well-healed scar noted in the mid rectum. No                            surrounding mucosal erythema or edema noted. Complications:            No immediate complications. Estimated Blood Loss:     Estimated blood loss was minimal. Impression:               - Perianal fistula found on perianal exam.                           - Nodular mucosa in the cecum. Biopsied. If this is                            confirmed to be adenomatous tissue, will plan on                            Endoscopic Mucosal Resection in the hospital.                           - One 15 mm flat area of polypoid tissue in the                            ascending colon. Injected to confirm ability to                            lift. Contralateral wall tattooed. Given the size,                            location, and need for additional lifting agents                            for en bloc resection, polypectomy was not                            attempted in favor of Endoscopic Mucosal Resection  in the hospital.                            - Diverticulosis in the sigmoid colon.                           - Non-bleeding internal hemorrhoids.                           - Normal mucosa in the recto-sigmoid colon, in the                            sigmoid colon, in the descending colon and in the                            transverse colon. Biopsied.                           - Small, well-healed scar noted in the mid rectum.                            No surrounding mucosal erythema or edema noted. Recommendation:           - Patient has a contact number available for                            emergencies. The signs and symptoms of potential                            delayed complications were discussed with the                            patient. Return to normal activities tomorrow.                            Written discharge instructions were provided to the                            patient.                           - Resume previous diet today.                           - Continue present medications.                           - Await pathology results.                           - Plan for repeat colonoscopy in the hospital for                            Endoscopic Mucosal Resection of polyps pending  today's pathology results.                           - Return to GI clinic at appointment to be                            scheduled. Gerrit Heck, MD 08/09/2018 11:28:58 AM

## 2018-08-09 NOTE — Progress Notes (Signed)
Temp-Heather Chandler cma  Vital signs-judy branson cma 

## 2018-08-09 NOTE — Progress Notes (Signed)
PT taken to PACU. Monitors in place. VSS. Report given to RN. 

## 2018-08-09 NOTE — Patient Instructions (Signed)
Handouts given for hemorrhoids, polyps, and diverticulosis.   Schedule repeat colonoscopy in the hospital setting after pathology results.   YOU HAD AN ENDOSCOPIC PROCEDURE TODAY AT [facility]:   Refer to the procedure report that was given to you for any specific questions about what was found during the examination.  If the procedure report does not answer your questions, please call your gastroenterologist to clarify.  If you requested that your care partner not be given the details of your procedure findings, then the procedure report has been included in a sealed envelope for you to review at your convenience later.  YOU SHOULD EXPECT: Some feelings of bloating in the abdomen. Passage of more gas than usual.  Walking can help get rid of the air that was put into your GI tract during the procedure and reduce the bloating. If you had a lower endoscopy (such as a colonoscopy or flexible sigmoidoscopy) you may notice spotting of blood in your stool or on the toilet paper. If you underwent a bowel prep for your procedure, you may not have a normal bowel movement for a few days.  Please Note:  You might notice some irritation and congestion in your nose or some drainage.  This is from the oxygen used during your procedure.  There is no need for concern and it should clear up in a day or so.  SYMPTOMS TO REPORT IMMEDIATELY:   Following lower endoscopy (colonoscopy or flexible sigmoidoscopy):  Excessive amounts of blood in the stool  Significant tenderness or worsening of abdominal pains  Swelling of the abdomen that is new, acute  Fever of 100F or higher  For urgent or emergent issues, a gastroenterologist can be reached at any hour by calling [phone].   DIET:  We do recommend a small meal at first, but then you may proceed to your regular diet.  Drink plenty of fluids but you should avoid alcoholic beverages for 24 hours.  ACTIVITY:  You should plan to take it easy  for the rest of today and you should NOT DRIVE or use heavy machinery until tomorrow (because of the sedation medicines used during the test).    FOLLOW UP: Our staff will call the number listed on your records 48-72 hours following your procedure to check on you and address any questions or concerns that you may have regarding the information given to you following your procedure. If we do not reach you, we will leave a message.  We will attempt to reach you two times.  During this call, we will ask if you have developed any symptoms of COVID 19. If you develop any symptoms (ie: fever, flu-like symptoms, shortness of breath, cough etc.) before then, please call [phone].  If you test positive for Covid 19 in the 2 weeks post procedure, please call and report this information to [place].    If any biopsies were taken you will be contacted by phone or by letter within the next 1-3 weeks.  Please call us at [phone] if you have not heard about the biopsies in 3 weeks.    SIGNATURES/CONFIDENTIALITY: You and/or your care partner have signed paperwork which will be entered into your electronic medical record.  These signatures attest to the fact that that the information above on your After Visit Summary has been reviewed and is understood.  Full responsibility of the confidentiality of this discharge information lies with you and/or your care-partner.

## 2018-08-10 ENCOUNTER — Telehealth: Payer: Self-pay | Admitting: Gastroenterology

## 2018-08-10 NOTE — Telephone Encounter (Signed)
Pt had colonoscopy 08/09/18 and stated that she was still sedated when Dr. Bryan Lemma was discussing findings with her--she does not remember how many polyps were found.  Pt requested a call to discuss future colonoscopy and plan going forward.

## 2018-08-11 ENCOUNTER — Other Ambulatory Visit: Payer: Self-pay

## 2018-08-11 ENCOUNTER — Ambulatory Visit
Admission: RE | Admit: 2018-08-11 | Discharge: 2018-08-11 | Disposition: A | Discharge status: Home / Self Care | Payer: 59 | Source: Ambulatory Visit | Attending: Internal Medicine | Admitting: Internal Medicine

## 2018-08-11 ENCOUNTER — Telehealth: Payer: Self-pay | Admitting: *Deleted

## 2018-08-11 DIAGNOSIS — K603 Anal fistula: Secondary | ICD-10-CM

## 2018-08-11 DIAGNOSIS — K640 First degree hemorrhoids: Secondary | ICD-10-CM

## 2018-08-11 DIAGNOSIS — D122 Benign neoplasm of ascending colon: Secondary | ICD-10-CM

## 2018-08-11 DIAGNOSIS — E049 Nontoxic goiter, unspecified: Secondary | ICD-10-CM

## 2018-08-11 DIAGNOSIS — Z8371 Family history of colonic polyps: Secondary | ICD-10-CM

## 2018-08-11 DIAGNOSIS — K5730 Diverticulosis of large intestine without perforation or abscess without bleeding: Secondary | ICD-10-CM

## 2018-08-11 NOTE — Telephone Encounter (Signed)
  Follow up Call-  Call back number 08/09/2018  Post procedure Call Back phone  # [phone]  Permission to leave phone message No  comments voicemail full  Some recent data might be hidden     Patient questions:  Do you have a fever, pain , or abdominal swelling? No. Pain Score  0 *  Have you tolerated food without any problems? Yes.    Have you been able to return to your normal activities? Yes.    Do you have any questions about your discharge instructions: Diet   No. Medications  No. Follow up visit  No.  Do you have questions or concerns about your Care? No.  Actions: * If pain score is 4 or above: No action needed, pain <4.  1. Have you developed a fever since your procedure? no  2.   Have you had an respiratory symptoms (SOB or cough) since your procedure? no  3.   Have you tested positive for COVID 19 since your procedure no  4.   Have you had any family members/close contacts diagnosed with the COVID 19 since your procedure?  no   If any of these questions are a yes, please inquire if patient has been seen by family doctor and route this note to Joylene John, Therapist, sports.

## 2018-08-17 ENCOUNTER — Other Ambulatory Visit: Payer: Self-pay

## 2018-08-17 DIAGNOSIS — Z8371 Family history of colonic polyps: Secondary | ICD-10-CM

## 2018-08-17 DIAGNOSIS — K603 Anal fistula: Secondary | ICD-10-CM

## 2018-08-17 DIAGNOSIS — K5730 Diverticulosis of large intestine without perforation or abscess without bleeding: Secondary | ICD-10-CM

## 2018-08-17 DIAGNOSIS — K648 Other hemorrhoids: Secondary | ICD-10-CM

## 2018-08-17 DIAGNOSIS — D122 Benign neoplasm of ascending colon: Secondary | ICD-10-CM

## 2018-08-17 DIAGNOSIS — Z1211 Encounter for screening for malignant neoplasm of colon: Secondary | ICD-10-CM

## 2018-08-17 DIAGNOSIS — K640 First degree hemorrhoids: Secondary | ICD-10-CM

## 2018-09-06 ENCOUNTER — Other Ambulatory Visit (HOSPITAL_COMMUNITY)
Admission: RE | Admit: 2018-09-06 | Discharge: 2018-09-06 | Disposition: A | Discharge status: Home / Self Care | Payer: 59 | Source: Ambulatory Visit | Attending: Gastroenterology | Admitting: Gastroenterology

## 2018-09-06 ENCOUNTER — Telehealth: Payer: Self-pay | Admitting: Gastroenterology

## 2018-09-06 DIAGNOSIS — Z1159 Encounter for screening for other viral diseases: Secondary | ICD-10-CM | POA: Insufficient documentation

## 2018-09-06 LAB — SARS CORONAVIRUS 2 (TAT 6-24 HRS): SARS Coronavirus 2: NEGATIVE

## 2018-09-06 NOTE — Telephone Encounter (Signed)
Patient called in wanting to speak with the nurse about her upcoming schedule hospital schedule procedure.

## 2018-09-06 NOTE — Telephone Encounter (Signed)
I spoke with the pt and she had questions regarding the procedure and the process of having a care partner with her.  She was advised that 1 person could accompany her and they would have to wear a mask.  The pt has been advised of the information and verbalized understanding.

## 2018-09-08 ENCOUNTER — Other Ambulatory Visit: Payer: Self-pay

## 2018-09-08 ENCOUNTER — Encounter (HOSPITAL_COMMUNITY): Payer: Self-pay | Admitting: Emergency Medicine

## 2018-09-08 NOTE — Anesthesia Preprocedure Evaluation (Addendum)
Anesthesia Evaluation  Patient identified by MRN, date of birth, ID band Patient awake    Reviewed: Allergy & Precautions, NPO status , Patient's Chart, lab work & pertinent test results  Airway Mallampati: II  TM Distance: >3 FB Neck ROM: Full    Dental no notable dental hx. (+) Teeth Intact, Dental Advisory Given   Pulmonary neg pulmonary ROS,    Pulmonary exam normal breath sounds clear to auscultation       Cardiovascular negative cardio ROS Normal cardiovascular exam Rhythm:Regular Rate:Normal     Neuro/Psych PSYCHIATRIC DISORDERS Anxiety negative neurological ROS     GI/Hepatic negative GI ROS, Neg liver ROS,   Endo/Other  negative endocrine ROS  Renal/GU negative Renal ROS  negative genitourinary   Musculoskeletal negative musculoskeletal ROS (+)   Abdominal   Peds  Hematology negative hematology ROS (+)   Anesthesia Other Findings   Reproductive/Obstetrics                            Anesthesia Physical Anesthesia Plan  ASA: II  Anesthesia Plan: MAC   Post-op Pain Management:    Induction: Intravenous  PONV Risk Score and Plan: 2 and Propofol infusion and Treatment may vary due to age or medical condition  Airway Management Planned: Natural Airway and Simple Face Mask  Additional Equipment:   Intra-op Plan:   Post-operative Plan:   Informed Consent: I have reviewed the patients History and Physical, chart, labs and discussed the procedure including the risks, benefits and alternatives for the proposed anesthesia with the patient or authorized representative who has indicated his/her understanding and acceptance.     Dental advisory given  Plan Discussed with: CRNA  Anesthesia Plan Comments:         Anesthesia Quick Evaluation

## 2018-09-09 ENCOUNTER — Ambulatory Visit (HOSPITAL_COMMUNITY)
Admission: RE | Admit: 2018-09-09 | Discharge: 2018-09-09 | Disposition: A | Discharge status: Home / Self Care | Payer: 59 | Attending: Gastroenterology | Admitting: Gastroenterology

## 2018-09-09 ENCOUNTER — Encounter (HOSPITAL_COMMUNITY): Payer: Self-pay | Admitting: Certified Registered Nurse Anesthetist

## 2018-09-09 ENCOUNTER — Encounter (HOSPITAL_COMMUNITY)
Admission: RE | Disposition: A | Discharge status: Home / Self Care | Payer: Self-pay | Source: Home / Self Care | Attending: Gastroenterology

## 2018-09-09 ENCOUNTER — Other Ambulatory Visit: Payer: Self-pay

## 2018-09-09 ENCOUNTER — Ambulatory Visit (HOSPITAL_COMMUNITY): Payer: 59 | Admitting: Anesthesiology

## 2018-09-09 DIAGNOSIS — Z86010 Personal history of colonic polyps: Secondary | ICD-10-CM | POA: Diagnosis not present

## 2018-09-09 DIAGNOSIS — Z888 Allergy status to other drugs, medicaments and biological substances status: Secondary | ICD-10-CM | POA: Diagnosis not present

## 2018-09-09 DIAGNOSIS — D120 Benign neoplasm of cecum: Secondary | ICD-10-CM | POA: Diagnosis not present

## 2018-09-09 DIAGNOSIS — D122 Benign neoplasm of ascending colon: Secondary | ICD-10-CM | POA: Diagnosis not present

## 2018-09-09 DIAGNOSIS — Z1211 Encounter for screening for malignant neoplasm of colon: Secondary | ICD-10-CM | POA: Insufficient documentation

## 2018-09-09 DIAGNOSIS — Z8371 Family history of colonic polyps: Secondary | ICD-10-CM | POA: Insufficient documentation

## 2018-09-09 DIAGNOSIS — K640 First degree hemorrhoids: Secondary | ICD-10-CM

## 2018-09-09 DIAGNOSIS — Z87442 Personal history of urinary calculi: Secondary | ICD-10-CM | POA: Insufficient documentation

## 2018-09-09 DIAGNOSIS — Z880 Allergy status to penicillin: Secondary | ICD-10-CM | POA: Insufficient documentation

## 2018-09-09 DIAGNOSIS — K648 Other hemorrhoids: Secondary | ICD-10-CM | POA: Diagnosis not present

## 2018-09-09 DIAGNOSIS — Z885 Allergy status to narcotic agent status: Secondary | ICD-10-CM | POA: Insufficient documentation

## 2018-09-09 DIAGNOSIS — F419 Anxiety disorder, unspecified: Secondary | ICD-10-CM | POA: Diagnosis not present

## 2018-09-09 DIAGNOSIS — Z83719 Family history of colon polyps, unspecified: Secondary | ICD-10-CM

## 2018-09-09 DIAGNOSIS — K5730 Diverticulosis of large intestine without perforation or abscess without bleeding: Secondary | ICD-10-CM | POA: Diagnosis not present

## 2018-09-09 DIAGNOSIS — K603 Anal fistula, unspecified: Secondary | ICD-10-CM

## 2018-09-09 DIAGNOSIS — Z801 Family history of malignant neoplasm of trachea, bronchus and lung: Secondary | ICD-10-CM | POA: Diagnosis not present

## 2018-09-09 DIAGNOSIS — Z803 Family history of malignant neoplasm of breast: Secondary | ICD-10-CM | POA: Insufficient documentation

## 2018-09-09 DIAGNOSIS — Z881 Allergy status to other antibiotic agents status: Secondary | ICD-10-CM | POA: Insufficient documentation

## 2018-09-09 DIAGNOSIS — Z806 Family history of leukemia: Secondary | ICD-10-CM | POA: Insufficient documentation

## 2018-09-09 DIAGNOSIS — K635 Polyp of colon: Secondary | ICD-10-CM | POA: Diagnosis not present

## 2018-09-09 HISTORY — PX: COLONOSCOPY WITH PROPOFOL: SHX5780

## 2018-09-09 HISTORY — PX: HEMOSTASIS CLIP PLACEMENT: SHX6857

## 2018-09-09 HISTORY — PX: ENDOSCOPIC MUCOSAL RESECTION: SHX6839

## 2018-09-09 HISTORY — DX: Presence of spectacles and contact lenses: Z97.3

## 2018-09-09 HISTORY — DX: Nontoxic multinodular goiter: E04.2

## 2018-09-09 HISTORY — DX: Other hemorrhoids: K64.8

## 2018-09-09 HISTORY — PX: SUBMUCOSAL LIFTING INJECTION: SHX6855

## 2018-09-09 SURGERY — COLONOSCOPY WITH PROPOFOL
Anesthesia: Monitor Anesthesia Care

## 2018-09-09 MED ORDER — SODIUM CHLORIDE 0.9 % IV SOLN: INTRAVENOUS | Status: DC

## 2018-09-09 MED ORDER — LACTATED RINGERS IV SOLN
INTRAVENOUS | Status: DC | PRN
  Administered 2018-09-09: 07:00:00 via INTRAVENOUS

## 2018-09-09 MED ORDER — PROPOFOL 10 MG/ML IV BOLUS
INTRAVENOUS | Status: DC | PRN
  Administered 2018-09-09: 20 mg via INTRAVENOUS
  Administered 2018-09-09: 30 mg via INTRAVENOUS

## 2018-09-09 MED ORDER — PROPOFOL 500 MG/50ML IV EMUL
INTRAVENOUS | Status: DC | PRN
  Administered 2018-09-09: 150 ug/kg/min via INTRAVENOUS

## 2018-09-09 MED ORDER — SPOT INK MARKER SYRINGE KIT
PACK | SUBMUCOSAL | Status: AC
  Filled 2018-09-09: qty 5

## 2018-09-09 MED ORDER — PROPOFOL 10 MG/ML IV BOLUS
INTRAVENOUS | Status: AC
  Filled 2018-09-09: qty 80

## 2018-09-09 MED ORDER — LACTATED RINGERS IV SOLN
INTRAVENOUS | Status: DC
  Administered 2018-09-09: 1000 mL via INTRAVENOUS

## 2018-09-09 MED ORDER — LIDOCAINE 2% (20 MG/ML) 5 ML SYRINGE
INTRAMUSCULAR | Status: DC | PRN
  Administered 2018-09-09: 80 mg via INTRAVENOUS

## 2018-09-09 SURGICAL SUPPLY — 22 items

## 2018-09-09 NOTE — Interval H&P Note (Signed)
History and Physical Interval Note:  09/09/2018 7:33 AM  Heather Chandler  has presented today for surgery, with the diagnosis of rpt colonosopy, colon polyps.  The various methods of treatment have been discussed with the patient and family. After consideration of risks, benefits and other options for treatment, the patient has consented to  Procedure(s): COLONOSCOPY WITH PROPOFOL (N/A) ENDOSCOPIC MUCOSAL RESECTION (N/A) as a surgical intervention.  The patient's history has been reviewed, patient examined, no change in status, stable for surgery.  I have reviewed the patient's chart and labs.  Questions were answered to the patient's satisfaction.     Dominic Pea Heather Chandler

## 2018-09-09 NOTE — Transfer of Care (Signed)
Immediate Anesthesia Transfer of Care Note  Patient: Heather Chandler  Procedure(s) Performed: COLONOSCOPY WITH PROPOFOL (N/A ) ENDOSCOPIC MUCOSAL RESECTION (N/A ) HEMOSTASIS CLIP PLACEMENT SUBMUCOSAL LIFTING INJECTION  Patient Location: Endoscopy Unit  Anesthesia Type:MAC  Level of Consciousness: awake, alert , oriented and patient cooperative  Airway & Oxygen Therapy: Patient Spontanous Breathing and Patient connected to face mask oxygen  Post-op Assessment: Report given to RN, Post -op Vital signs reviewed and stable and Patient moving all extremities  Post vital signs: Reviewed and stable  Last Vitals:  Vitals Value Taken Time  BP 140/87 09/09/18 0903  Temp    Pulse 75 09/09/18 0905  Resp 17 09/09/18 0906  SpO2 100 % 09/09/18 0905  Vitals shown include unvalidated device data.  Last Pain:  Vitals:   09/09/18 0702  TempSrc: Oral  PainSc: 0-No pain         Complications: No apparent anesthesia complications

## 2018-09-09 NOTE — Op Note (Signed)
[facility] Patient Name: Heather Chandler Procedure Date: 09/09/2018 MRN: [id number] Attending MD: Gerrit Heck , MD Date of Birth: June 09, 1960 CSN: [id number] Age: 58 Admit Type: Inpatient Procedure:                Colonoscopy Indications:              High risk colon cancer surveillance: Personal                            history of sessile serrated colon polyp (10 mm or                            greater in size), Therapeutic procedure for colon                            polyps                           58 yo female with 2 flat SSPs noted on colonsocopy                            in 07/2018, presents today for Endoscopic Mucosal                            Resection. Providers:                Gerrit Heck, MD, Cleda Daub, RN, Ladona Ridgel, Technician Referring MD:              Medicines:                Monitored Anesthesia Care Complications:            No immediate complications. Estimated Blood Loss:     Estimated blood loss was minimal. Procedure:                Pre-Anesthesia Assessment:                           - Prior to the procedure, a History and Physical                            was performed, and patient medications and                            allergies were reviewed. The patient's tolerance of                            previous anesthesia was also reviewed. The risks                            and benefits of the procedure and the sedation                            options and risks were discussed with the patient.  All questions were answered, and informed consent                            was obtained. Prior Anticoagulants: The patient has                            taken no previous anticoagulant or antiplatelet                            agents. ASA Grade Assessment: II - A patient with                            mild systemic disease. After reviewing the risks         and benefits, the patient was deemed in                            satisfactory condition to undergo the procedure.                           After obtaining informed consent, the colonoscope                            was passed under direct vision. Throughout the                            procedure, the patient's blood pressure, pulse, and                            oxygen saturations were monitored continuously. The                            CF-HQ190L ([id number]) Olympus colonoscope was                            introduced through the anus and advanced to the the                            cecum, identified by appendiceal orifice and                            ileocecal valve. The colonoscopy was performed                            without difficulty. The patient tolerated the                            procedure well. The quality of the bowel                            preparation was adequate. The ileocecal valve,                            appendiceal orifice, and rectum were photographed. Scope In: 7:47:08 AM Scope Out: 8:54:51 AM  Scope Withdrawal Time: 1 hour 3 minutes 47 seconds  Total Procedure Duration: 1 hour 7 minutes 43 seconds  Findings:      There was a scar from a previous perianal fistula (non-draining).       Digital rectal examinations otherwise normal.      A 8 mm polyp was found in the cecum. The polyp was flat. The polyp was       removed with a cold snare. Resection and retrieval were complete. To       prevent bleeding after the polypectomy, one hemostatic clip was       successfully placed. There was no bleeding at the end of the procedure.      A 20 mm polyp was found in the ascending colon. The polyp was flat with       adherent mucus cap. This was best visualized in the retroflexed view.       The outer edges of the polyp were marked with cautery prir to lift,       marking 2 mm outside the polyp border. The polyp was then removed with a       lift  and cut technique using a Orise and hot snare. Resection completed       via piecemeal technique and retrieval was complete. To prevent bleeding       after the polypectomy, five hemostatic clips were successfully placed.       There was no bleeding at the end of the procedure and the colon retained       air insufflation well. The air was then removed with suction.      A tattoo was seen in the ascending colon on the wall opposite the larger       flat polyp.      A single small-mouthed diverticulum was found in the sigmoid colon.      Non-bleeding internal hemorrhoids were found during retroflexion. The       hemorrhoids were small. Impression:               - One 8 mm polyp in the cecum, removed with a cold                            snare. Resected and retrieved. Clip was placed.                           - One 20 mm flat polyp in the ascending colon,                            removed using Orise lift and piecemeal resection                            with a hot snare. Resected and retrieved. 5 Clips                            were placed for closure and to prevent post                            polypectomy bleed.                           -  A tattoo was seen in the ascending colon.                           - Diverticulosis in the sigmoid colon.                           - Non-bleeding internal hemorrhoids. Moderate Sedation:      Not Applicable - Patient had care per Anesthesia. Recommendation:           - Patient has a contact number available for                            emergencies. The signs and symptoms of potential                            delayed complications were discussed with the                            patient. Return to normal activities tomorrow.                            Written discharge instructions were provided to the                            patient.                           - Resume previous diet.                           - Continue present  medications.                           - Await pathology results.                           - Repeat colonoscopy in 1 year for surveillance                            after piecemeal polypectomy.                           - Return to GI clinic PRN.                           - Use fiber, for example Citrucel, Fibercon, Konsyl                            or Metamucil. Procedure Code(s):        --- Professional ---                           [phone], Colonoscopy, flexible; with removal of                            tumor(s), polyp(s), or other lesion(s) by snare  technique Diagnosis Code(s):        --- Professional ---                           K63.5, Polyp of colon                           Z86.010, Personal history of colonic polyps                           K64.8, Other hemorrhoids                           K57.30, Diverticulosis of large intestine without                            perforation or abscess without bleeding CPT copyright 2019 [employer]. All rights reserved. The codes documented in this report are preliminary and upon coder review may  be revised to meet current compliance requirements. Gerrit Heck, MD 09/09/2018 9:15:17 AM Number of Addenda: 0

## 2018-09-09 NOTE — H&P (Signed)
Chief Complaint: Colon Polyps  GI History: Heather Chandler is a 58 y.o. female w a hx of hemorrhoids, anal fistula diagnosed 2014,, chronic constipation, referred to the [facility] in 05/2018 for evaluation of ongoing CRC screening.  Prior to 2020, last colonoscopy was completed by Dr. Oletta Lamas at [facility] in 04/2012 and n/f internal hemorrhoids, with recommendation to repeat in 5 year d/t family history of advanced polyps.  She was additionally diagnosed with perianal fistula  in 2014 at the time of colonoscopy, with MRI pelvis in 05/2012 demonstrating grade 3/4 perianal fistula, requiring seton placement in 01/2013 by Dr. Marcello Moores.  Seton spontaneously removed in 2015. Fistula will drain periodically since then, without infection, fever. No associated change in bowel habits, hematochezia, melena.    Endoscopic history: -Colonoscopy (08/09/2018, Dr. Bryan Lemma): Localized area of nodular mucosa immediately adjacent to the AO (biopsy: SSP), 15 mm flat SSP in the ascending colon (biopsy: SSP; tattoo placed on contralateral wall), single sigmoid diverticulum, internal hemorrhoids.  Otherwise normal colon with biopsies negative for MC.  Scar from prior perianal fistula noted referred to the hospital for EMR of both flat polyps - Colonoscopy (04/2012, Dr. Oletta Lamas): 0.5 cm nodule noted 2-3 cm from anus on left buttocks, without anal fissure, internal hemorhoids. Normal colon. Recommended repeat in 5 years d/t FHx - Colonoscopy (2000, Dr. Oletta Lamas): Internal hemorrhoids, o/w normal.  HPI:     Heather Chandler is a 58 y.o. female presenting today for resection of known flat polyps in the cecum and ascending colon, previously biopsied and consistent with SSP's.  Plan for Endoscopic Mucosal Resection today.  Otherwise no GI symptoms.  Has been in her usual state of health since her last appointment.  No drainage from prior perianal fistula.   Past Medical History:  Diagnosis Date  . Anal  fistula    HAVING BLEEDING AT TIMES AND UNCOMFORTABLE   . Complication of anesthesia    SLOW TO WAKE UP - EVEN AFTER COLONOSCOPY - I GO HOME AND SLEEP FOR HOURS  . History of kidney stones    HAD LITHO - BUT IT DID NOT CRUSH THE STONE - STILL HAVE THE STONE  . Internal hemorrhoids   . Multinodular goiter    "my scan this year , they said it looked really good"   . Multinodular goiter (nontoxic)    PT STATES HER THROID LEVELS WERE OK WHEN CHECKED IN OCT 2014  . Osteoporosis   . Wears contact lenses   . Wears eyeglasses      Past Surgical History:  Procedure Laterality Date  . BREAST SURGERY     enhancement Sx  . CARDIOVASCULAR STRESS TEST  several years ago   indication at the time was "some chest pain; i think i was being paranoid, anywya the doctor said my heart was very strong"   . COLONOSCOPY    . COLONOSCOPY  08/09/2018  . EVALUATION UNDER ANESTHESIA WITH ANAL FISTULECTOMY N/A 03/01/2013   Procedure: EXAM UNDER ANESTHESIA WITH, ANAL BIOPSY, SETON PLACEMENT;  Surgeon: Pedro Earls, MD;  Location: [facility];  Service: General;  Laterality: N/A;  . LAPAROSCOPY    . LITHOTRIPSY  2010   unsucessful, too alrge, still in place   . RECTAL ULTRASOUND N/A 04/19/2013   Procedure: anal ULTRASOUND;  Surgeon: Leighton Ruff, MD;  Location: [facility];  Service: Endoscopy;  Laterality: N/A;  pt tol procedure well...segment of anal muscle with  predominant scar tissue...discussion by Dr. Marcello Moores with pt in procedure room prior to leaving ...  . REVISION OF SCAR    . TONSILLECTOMY     Family History  Problem Relation Age of Onset  . Cancer Mother        breast & uterian  . Cancer Father        leukemia  . Cancer Maternal Aunt        breast  . Cancer Maternal Grandmother        breast  . Cancer Maternal Grandfather        lung  . Colon polyps Brother   . Colon cancer Neg Hx   . Esophageal cancer Neg Hx   . Rectal cancer Neg Hx   . Stomach cancer Neg Hx    Social History    Tobacco Use  . Smoking status: Never Smoker  . Smokeless tobacco: Never Used  Substance Use Topics  . Alcohol use: Yes    Alcohol/week: 5.0 standard drinks    Types: 5 Standard drinks or equivalent per week    Comment: COUPLE GLASSES WINE OR A BEER PER WEEK  . Drug use: No   Current Facility-Administered Medications  Medication Dose Route Frequency Provider Last Rate Last Dose  . 0.9 %  sodium chloride infusion   Intravenous Continuous Samier Jaco V, DO      . 0.9 %  sodium chloride infusion   Intravenous Continuous Mauricia Mertens V, DO      . lactated ringers infusion   Intravenous Continuous Naziyah Tieszen V, DO 10 mL/hr at 09/09/18 0708 1,000 mL at 09/09/18 0708   Facility-Administered Medications Ordered in Other Encounters  Medication Dose Route Frequency Provider Last Rate Last Dose  . lactated ringers infusion    Continuous PRN Edathil, Roshen T, CRNA       Allergies  Allergen Reactions  . Ceclor [Cefaclor] Rash  . Codeine      REACTION AS A CHILD -  FAINTED --DID NOT HAVE TO GO TO ER  . Penicillins Rash    Has patient had a PCN reaction causing immediate rash, facial/tongue/throat swelling, SOB or lightheadedness with hypotension: yes Has patient had a PCN reaction causing severe rash involving mucus membranes or skin necrosis:yes  Has patient had a PCN reaction that required hospitalization: no Has patient had a PCN reaction occurring within the last 10 years: yes If all of the above answers are "NO", then may proceed with Cephalosporin use.   . Flagyl [Metronidazole]      CAUSED DIARRHEA,   . Morphine And Related     GI upset     Review of Systems: All systems reviewed and negative except where noted in HPI.     Physical Exam:    Wt Readings from Last 3 Encounters:  09/09/18 50.3 kg  08/09/18 49.9 kg  08/02/18 49.9 kg    BP 109/72   Pulse (!) 4   Temp 97.9 F (36.6 C) (Oral)   Resp 17   Ht 5\' 3"  (1.6 m)   Wt 50.3 kg   SpO2 99%   BMI  19.64 kg/m  Constitutional:  Pleasant, in no acute distress. Psychiatric: Normal mood and affect. Behavior is normal. EENT: Pupils normal.  Conjunctivae are normal. No scleral icterus. Neck supple. No cervical LAD. Cardiovascular: Normal rate, regular rhythm. No edema Pulmonary/chest: Effort normal and breath sounds normal. No wheezing, rales or rhonchi. Abdominal: Soft, nondistended, nontender. Bowel sounds active throughout. There are no masses palpable.  No hepatomegaly. Neurological: Alert and oriented to person place and time. Skin: Skin is warm and dry. No rashes noted.   ASSESSMENT AND PLAN;   Heather Chandler is a 58 y.o. female with 2 right-sided flat sessile serrated polyps, presents today for endoscopic mucosal resection.  -Colonoscopy with planned EMR -Not taking any blood thinners or antiplatelet therapy  The indications, risks, and benefits of colonoscopy and Endoscopic Mucosal Resection were explained to the patient in detail. Risks include but are not limited to bleeding, perforation, adverse reaction to medications, and cardiopulmonary compromise. Sequelae include but are not limited to the possibility of surgery, hospitalization, and mortality. The patient verbalized understanding and wished to proceed. All questions answered, referred to the scheduler and bowel prep ordered. Further recommendations pending results of the exam.     Dominic Pea Sybel Standish, DO, FACG  09/09/2018, 7:20 AM

## 2018-09-09 NOTE — Discharge Instructions (Signed)

## 2018-09-09 NOTE — Anesthesia Postprocedure Evaluation (Signed)
Anesthesia Post Note  Patient: Heather Chandler  Procedure(s) Performed: COLONOSCOPY WITH PROPOFOL (N/A ) ENDOSCOPIC MUCOSAL RESECTION (N/A ) HEMOSTASIS CLIP PLACEMENT SUBMUCOSAL LIFTING INJECTION     Patient location during evaluation: Endoscopy Anesthesia Type: MAC Level of consciousness: awake and alert Pain management: pain level controlled Vital Signs Assessment: post-procedure vital signs reviewed and stable Respiratory status: spontaneous breathing, nonlabored ventilation, respiratory function stable and patient connected to nasal cannula oxygen Cardiovascular status: blood pressure returned to baseline and stable Postop Assessment: no apparent nausea or vomiting Anesthetic complications: no    Last Vitals:  Vitals:   09/09/18 0920 09/09/18 0930  BP: (!) 136/49 127/66  Pulse: 72 65  Resp: (!) 25 (!) 23  Temp:    SpO2: 99% 98%    Last Pain:  Vitals:   09/09/18 0930  TempSrc:   PainSc: 0-No pain                 Herma Uballe L Amanee Iacovelli

## 2018-09-12 ENCOUNTER — Encounter (HOSPITAL_COMMUNITY): Payer: Self-pay | Admitting: Gastroenterology

## 2018-09-13 ENCOUNTER — Telehealth: Payer: Self-pay | Admitting: Gastroenterology

## 2018-09-13 NOTE — Telephone Encounter (Signed)
Dr Bryan Lemma the pt is calling for pathology results.  Please advise

## 2018-09-13 NOTE — Telephone Encounter (Signed)
Patient called in to discuss results

## 2018-09-15 NOTE — Telephone Encounter (Signed)
Pt is calling back would like a call before the end of today because of holiday.

## 2018-09-15 NOTE — Telephone Encounter (Signed)
Have discussed the biopsy results with the patient. Recommend repeat colonoscopy in 1 year as  Dr. Vivia Ewing plan.  RG

## 2018-09-15 NOTE — Telephone Encounter (Signed)
Pt calling to check to see if she could results from pathology. Procedure  Was on 09/09/2018

## 2018-09-15 NOTE — Telephone Encounter (Signed)
Dr Lyndel Safe the pt is calling back for path results.

## 2018-09-19 NOTE — Telephone Encounter (Signed)
1 yr colonoscopy recall placed in Epic;

## 2018-09-20 ENCOUNTER — Encounter: Payer: Self-pay | Admitting: Gastroenterology

## 2018-09-20 NOTE — Progress Notes (Signed)
Letter mailed to patient on 09/20/2018/BG

## 2018-12-22 ENCOUNTER — Other Ambulatory Visit: Payer: Self-pay

## 2018-12-22 DIAGNOSIS — Z20822 Contact with and (suspected) exposure to covid-19: Secondary | ICD-10-CM

## 2018-12-24 LAB — NOVEL CORONAVIRUS, NAA: SARS-CoV-2, NAA: NOT DETECTED

## 2019-09-01 ENCOUNTER — Encounter: Payer: Self-pay | Admitting: Gastroenterology

## 2019-10-05 ENCOUNTER — Ambulatory Visit (AMBULATORY_SURGERY_CENTER): Payer: Self-pay | Admitting: *Deleted

## 2019-10-05 ENCOUNTER — Other Ambulatory Visit: Payer: Self-pay

## 2019-10-05 VITALS — Ht 63.0 in | Wt 116.2 lb

## 2019-10-05 DIAGNOSIS — Z86010 Personal history of colonic polyps: Secondary | ICD-10-CM

## 2019-10-05 MED ORDER — SUTAB 1479-225-188 MG PO TABS: 1.0000 | ORAL_TABLET | Freq: Once | ORAL | 0 refills | Status: AC

## 2019-10-05 NOTE — Progress Notes (Signed)
2nd dose of covid vaccine 06-19-19  Pt is aware that care partner will wait in the car during procedure; if they feel like they will be too hot or cold to wait in the car; they may wait in the 4 th floor lobby. Patient is aware to bring only one care partner. We want them to wear a mask (we do not have any that we can provide them), practice social distancing, and we will check their temperatures when they get here.  I did remind the patient that their care partner needs to stay in the parking lot the entire time and have a cell phone available, we will call them when the pt is ready for discharge. Patient will wear mask into building.   Pt states she had nausea and vomiting after lithotripsy, is slow to wake up  Denies difficulty of intubation, or fam hx of malignant hyperthermia   No egg or soy allergy  No home oxygen use   No medications for weight loss taken  emmi information given   Sutab code put into RX and paper copy given to pt to show pharmacy

## 2019-10-06 ENCOUNTER — Encounter: Payer: Self-pay | Admitting: Gastroenterology

## 2019-10-17 ENCOUNTER — Encounter: Payer: Self-pay | Admitting: Certified Registered Nurse Anesthetist

## 2019-10-18 ENCOUNTER — Ambulatory Visit (AMBULATORY_SURGERY_CENTER): Payer: 59 | Admitting: Gastroenterology

## 2019-10-18 ENCOUNTER — Encounter: Payer: Self-pay | Admitting: Gastroenterology

## 2019-10-18 ENCOUNTER — Other Ambulatory Visit: Payer: Self-pay

## 2019-10-18 VITALS — BP 129/81 | HR 75 | Temp 96.6°F | Resp 20 | Ht 63.0 in | Wt 116.2 lb

## 2019-10-18 DIAGNOSIS — D120 Benign neoplasm of cecum: Secondary | ICD-10-CM | POA: Diagnosis not present

## 2019-10-18 DIAGNOSIS — K635 Polyp of colon: Secondary | ICD-10-CM

## 2019-10-18 DIAGNOSIS — K5730 Diverticulosis of large intestine without perforation or abscess without bleeding: Secondary | ICD-10-CM

## 2019-10-18 DIAGNOSIS — Z86010 Personal history of colonic polyps: Secondary | ICD-10-CM | POA: Diagnosis present

## 2019-10-18 DIAGNOSIS — K6389 Other specified diseases of intestine: Secondary | ICD-10-CM | POA: Diagnosis not present

## 2019-10-18 DIAGNOSIS — D125 Benign neoplasm of sigmoid colon: Secondary | ICD-10-CM

## 2019-10-18 DIAGNOSIS — K640 First degree hemorrhoids: Secondary | ICD-10-CM

## 2019-10-18 DIAGNOSIS — D122 Benign neoplasm of ascending colon: Secondary | ICD-10-CM

## 2019-10-18 MED ORDER — SODIUM CHLORIDE 0.9 % IV SOLN: 500.0000 mL | Freq: Once | INTRAVENOUS | Status: DC

## 2019-10-18 NOTE — Op Note (Signed)
[facility] Patient Name: Heather Chandler Procedure Date: 10/18/2019 8:33 AM MRN: [id number] Endoscopist: Gerrit Heck , MD Age: 59 Referring MD:  Date of Birth: Apr 21, 1960 Gender: Female Account #: [id number] Procedure:                Colonoscopy Indications:              High risk colon cancer surveillance: Personal                            history of sessile serrated colon polyp (10 mm or                            greater in size)                           Colonoscopy in 08/2018 with 8 mm flat polyp in cecum                            removed with cold snare and clip x1, and 20 mm flat                            polyp in ascending colon removed with hot snare and                            hemostatic clips x5. Path: SSP. Presents today for                            short interval surveillance. Medicines:                Monitored Anesthesia Care Procedure:                Pre-Anesthesia Assessment:                           - Prior to the procedure, a History and Physical                            was performed, and patient medications and                            allergies were reviewed. The patient's tolerance of                            previous anesthesia was also reviewed. The risks                            and benefits of the procedure and the sedation                            options and risks were discussed with the patient.                            All questions were answered, and informed consent  was obtained. Prior Anticoagulants: The patient has                            taken no previous anticoagulant or antiplatelet                            agents. ASA Grade Assessment: II - A patient with                            mild systemic disease. After reviewing the risks                            and benefits, the patient was deemed in                            satisfactory condition to undergo the procedure.                            After obtaining informed consent, the colonoscope                            was passed under direct vision. Throughout the                            procedure, the patient's blood pressure, pulse, and                            oxygen saturations were monitored continuously. The                            Colonoscope was introduced through the anus and                            advanced to the the cecum, identified by                            appendiceal orifice and ileocecal valve. The                            colonoscopy was performed without difficulty. The                            patient tolerated the procedure well. The quality                            of the bowel preparation was good. The ileocecal                            valve, appendiceal orifice, and rectum were                            photographed. Scope In: 8:41:52 AM Scope Out: 9:15:32 AM Scope Withdrawal Time: 0 hours 30 minutes 1 second  Total Procedure Duration: 0 hours 33 minutes  40 seconds  Findings:                 The perianal and digital rectal examinations were                            normal.                           A small post polypectomy scar was found in the                            cecum. There suble nodularity noted on NBI.                            Biopsies were taken with a cold forceps for                            histology. Estimated blood loss was minimal.                           A post polypectomy scar was found in the ascending                            colon. There was residual polypoid tissue adjacent                            to the scar. This was approximately 5 mm in size.                            Narrow Band Imaging (NBI) was performed to                            visualize the mucosa. Area was successfully                            injected with 4 mL saline for a lift polypectomy.                            There was scar tissue beneath the surface  that made                            the lift difficult. The polypoid tissue was removed                            with cold snare. Biopsies were taken from the edges                            via avulsion technique with a cold forceps.                            Estimated blood loss was minimal.  A tattoo was seen in the ascending colon on the                            contralateral wall from the ascending scar.                           A 2 mm polyp was found in the sigmoid colon. The                            polyp was sessile. The polyp was removed with a                            cold biopsy forceps. Resection and retrieval were                            complete. Estimated blood loss was minimal.                           A few small-mouthed diverticula were found in the                            sigmoid colon.                           Non-bleeding internal hemorrhoids were found during                            retroflexion. The hemorrhoids were small and Grade                            I (internal hemorrhoids that do not prolapse).                           Retroflexion in the right colon was performed. Complications:            No immediate complications. Estimated Blood Loss:     Estimated blood loss was minimal. Impression:               - Post-polypectomy scar in the cecum. Biopsied.                           - Post-polypectomy scar in the ascending colon.                            Injected with saline, resected with cold snare,                            then cold forceps used for resection of edges via                            avulsion technique.                           - A tattoo was seen in the ascending colon.                           -  One 2 mm polyp in the sigmoid colon, removed with                            a cold biopsy forceps. Resected and retrieved.                           - Diverticulosis in the sigmoid colon.                            - Non-bleeding internal hemorrhoids. Recommendation:           - Patient has a contact number available for                            emergencies. The signs and symptoms of potential                            delayed complications were discussed with the                            patient. Return to normal activities tomorrow.                            Written discharge instructions were provided to the                            patient.                           - Resume previous diet.                           - Continue present medications.                           - Await pathology results.                           - Repeat colonoscopy in 2 years for surveillance.                           - Return to GI office PRN. Gerrit Heck, MD 10/18/2019 9:28:08 AM

## 2019-10-18 NOTE — Progress Notes (Signed)
Called to room to assist during endoscopic procedure.  Patient ID and intended procedure confirmed with present staff. Received instructions for my participation in the procedure from the performing physician.  

## 2019-10-18 NOTE — Progress Notes (Signed)
Report given to PACU, vss 

## 2019-10-18 NOTE — Progress Notes (Signed)
Pt's states no medical or surgical changes since previsit or office visit. 

## 2019-10-18 NOTE — Patient Instructions (Signed)
Please read handouts provided. Continue present medications. Await pathology results. Repeat colonoscopy in 2 years for surveillance. Return to GI office as needed.     YOU HAD AN ENDOSCOPIC PROCEDURE TODAY AT [facility]:   Refer to the procedure report that was given to you for any specific questions about what was found during the examination.  If the procedure report does not answer your questions, please call your gastroenterologist to clarify.  If you requested that your care partner not be given the details of your procedure findings, then the procedure report has been included in a sealed envelope for you to review at your convenience later.  YOU SHOULD EXPECT: Some feelings of bloating in the abdomen. Passage of more gas than usual.  Walking can help get rid of the air that was put into your GI tract during the procedure and reduce the bloating. If you had a lower endoscopy (such as a colonoscopy or flexible sigmoidoscopy) you may notice spotting of blood in your stool or on the toilet paper. If you underwent a bowel prep for your procedure, you may not have a normal bowel movement for a few days.  Please Note:  You might notice some irritation and congestion in your nose or some drainage.  This is from the oxygen used during your procedure.  There is no need for concern and it should clear up in a day or so.  SYMPTOMS TO REPORT IMMEDIATELY:   Following lower endoscopy (colonoscopy or flexible sigmoidoscopy):  Excessive amounts of blood in the stool  Significant tenderness or worsening of abdominal pains  Swelling of the abdomen that is new, acute  Fever of 100F or higher   For urgent or emergent issues, a gastroenterologist can be reached at any hour by calling [phone]. Do not use MyChart messaging for urgent concerns.    DIET:  We do recommend a small meal at first, but then you may proceed to your regular diet.  Drink plenty of fluids but you should  avoid alcoholic beverages for 24 hours.  ACTIVITY:  You should plan to take it easy for the rest of today and you should NOT DRIVE or use heavy machinery until tomorrow (because of the sedation medicines used during the test).    FOLLOW UP: Our staff will call the number listed on your records 48-72 hours following your procedure to check on you and address any questions or concerns that you may have regarding the information given to you following your procedure. If we do not reach you, we will leave a message.  We will attempt to reach you two times.  During this call, we will ask if you have developed any symptoms of COVID 19. If you develop any symptoms (ie: fever, flu-like symptoms, shortness of breath, cough etc.) before then, please call [phone].  If you test positive for Covid 19 in the 2 weeks post procedure, please call and report this information to [place].    If any biopsies were taken you will be contacted by phone or by letter within the next 1-3 weeks.  Please call us at [phone] if you have not heard about the biopsies in 3 weeks.    SIGNATURES/CONFIDENTIALITY: You and/or your care partner have signed paperwork which will be entered into your electronic medical record.  These signatures attest to the fact that that the information above on your After Visit Summary has been reviewed and is understood.  Full responsibility of the confidentiality of  this discharge information lies with you and/or your care-partner.

## 2019-10-20 ENCOUNTER — Telehealth: Payer: Self-pay | Admitting: *Deleted

## 2019-10-20 NOTE — Telephone Encounter (Signed)
°  Follow up Call-  Call back number 10/18/2019 08/09/2018  Post procedure Call Back phone  # [phone]  Permission to leave phone message Yes No  comments - voicemail full  Some recent data might be hidden     Patient questions:  Do you have a fever, pain , or abdominal swelling? No. Pain Score  0 *  Have you tolerated food without any problems? Yes.    Have you been able to return to your normal activities? Yes.    Do you have any questions about your discharge instructions: Diet   No. Medications  No. Follow up visit  No.  Do you have questions or concerns about your Care? No.  Actions: * If pain score is 4 or above: No action needed, pain <4.  1. Have you developed a fever since your procedure? no  2.   Have you had an respiratory symptoms (SOB or cough) since your procedure? no  3.   Have you tested positive for COVID 19 since your procedure no  4.   Have you had any family members/close contacts diagnosed with the COVID 19 since your procedure?  no   If yes to any of these questions please route to Joylene John, RN and Erenest Rasher, RN

## 2019-12-26 IMAGING — US US THYROID
1 series · 12 of 25 positions shown · non-contrast
Comparison: 07/19/2017;

CLINICAL DATA: Prior ultrasound follow-up. History of multinodular
goiter. History of ultrasound-guided fine-needle aspiration of right
superior nodule as well as left mid nodule, both performed on
01/30/2014.

EXAM:
THYROID ULTRASOUND
TECHNIQUE: Ultrasound examination of the thyroid gland and adjacent soft
tissues was performed.

[Series 1: us thyroid · 0.04mm/px · 12 of 55 slices shown]
[im 3/55]
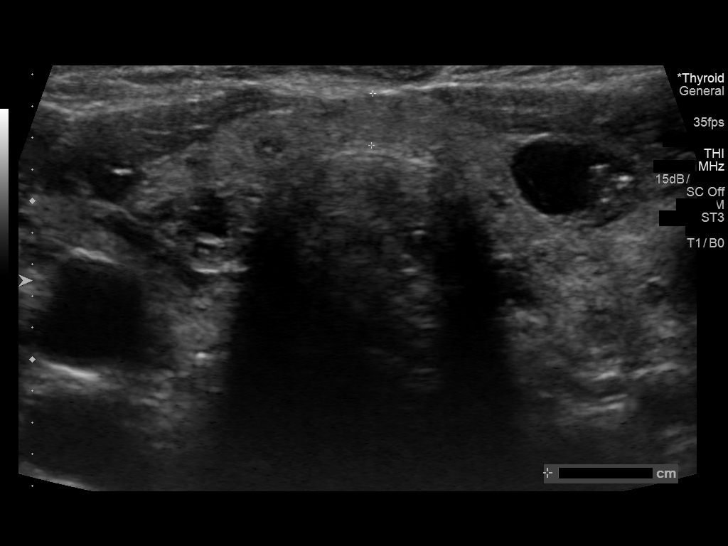
[im 7/55]
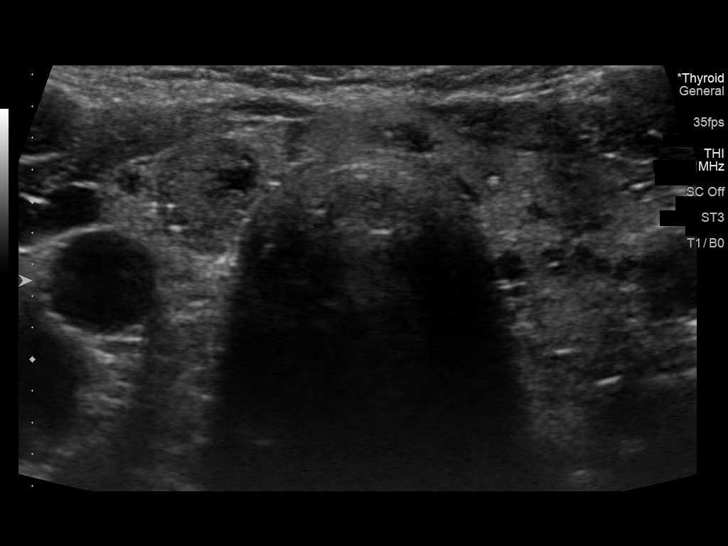
[im 12/55]
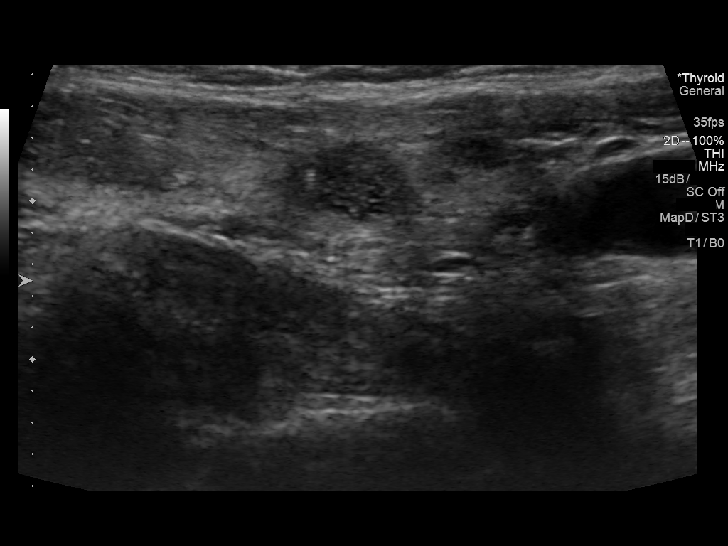
[im 16/55]
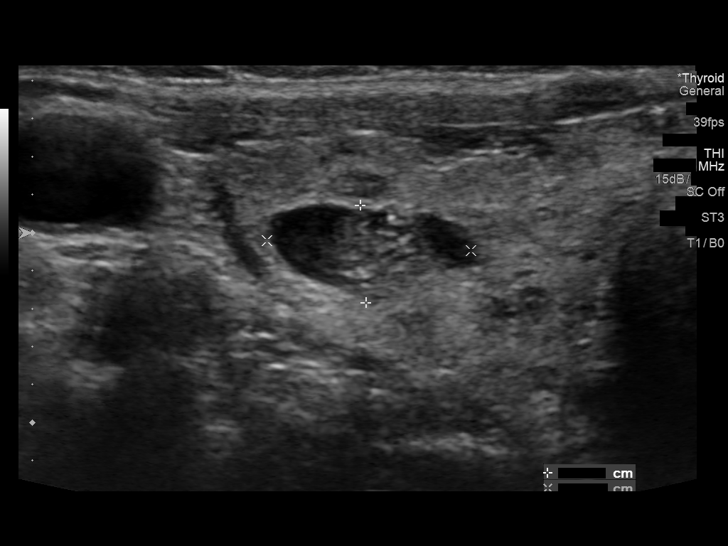
[im 21/55]
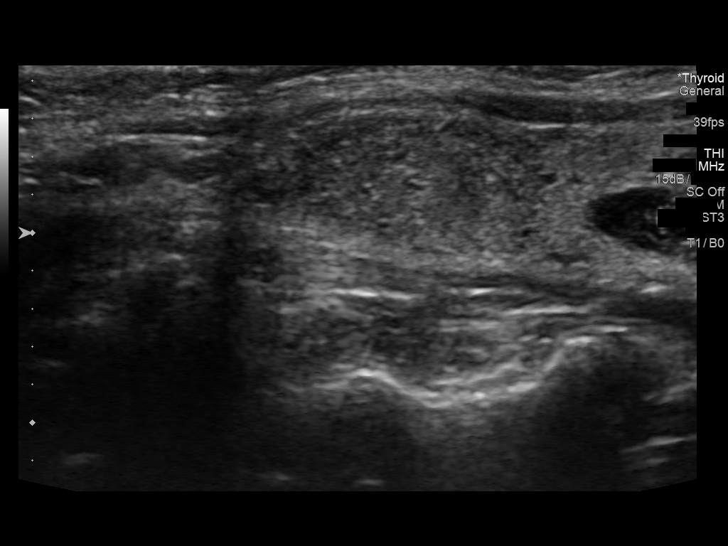
[im 25/55]
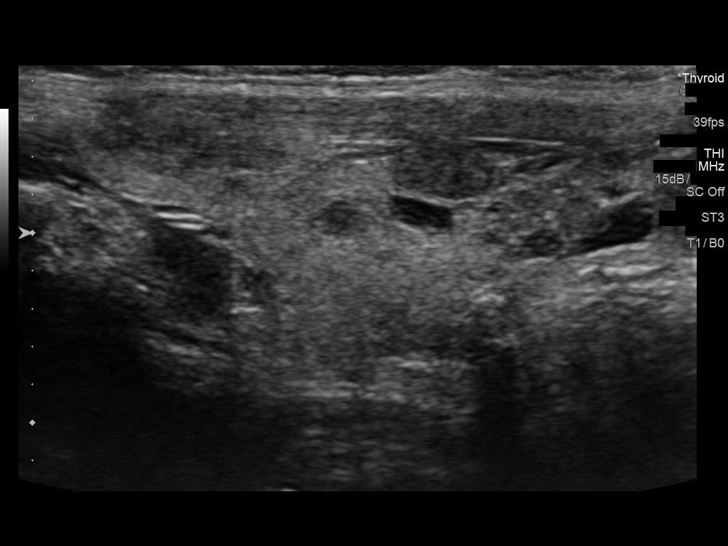
[im 30/55]
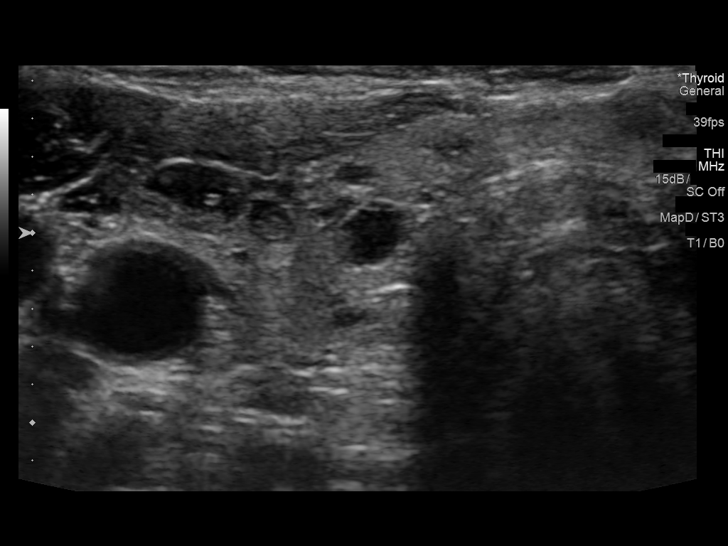
[im 34/55]
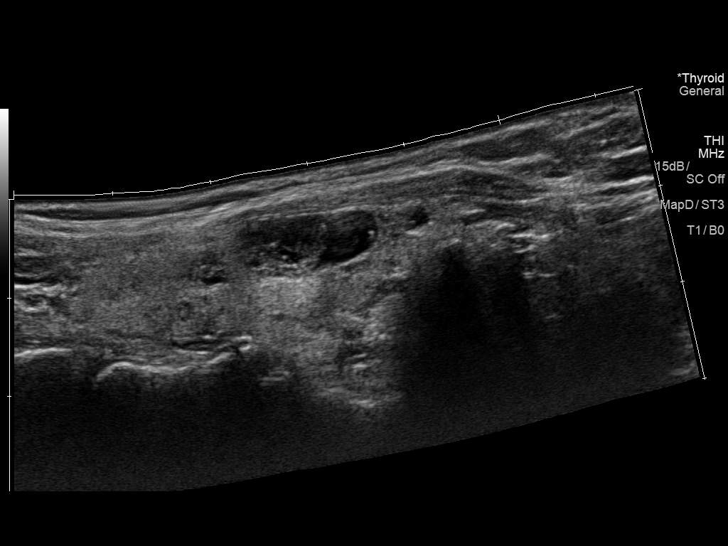
[im 39/55]
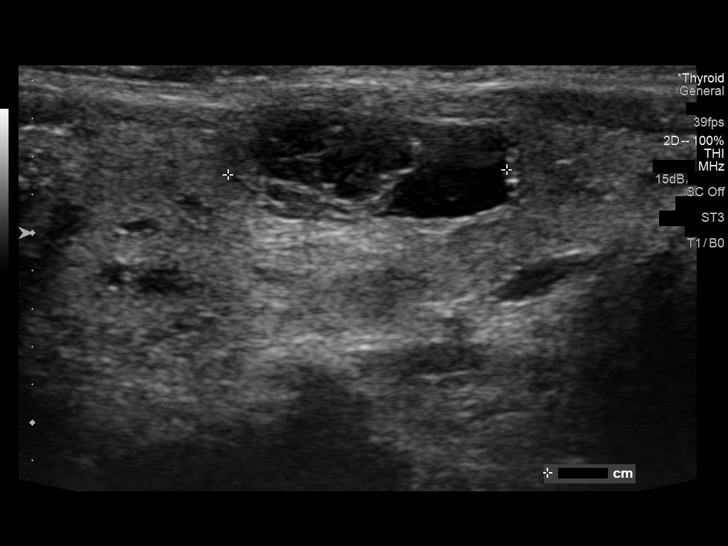
[im 43/55]
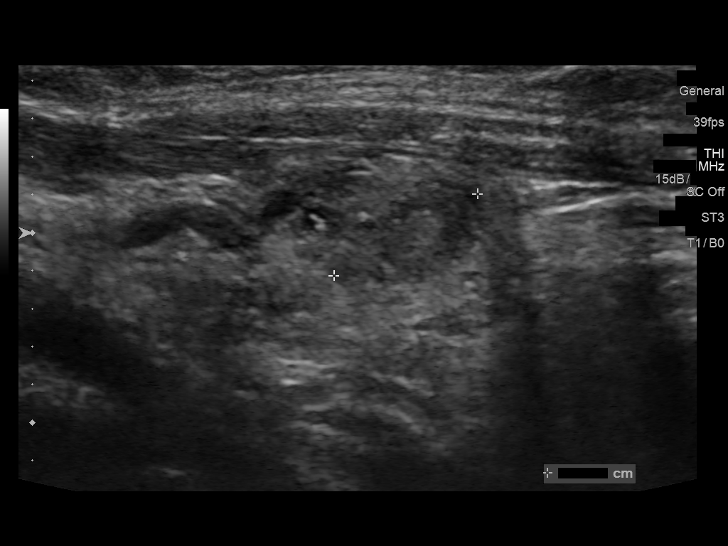
[im 48/55]
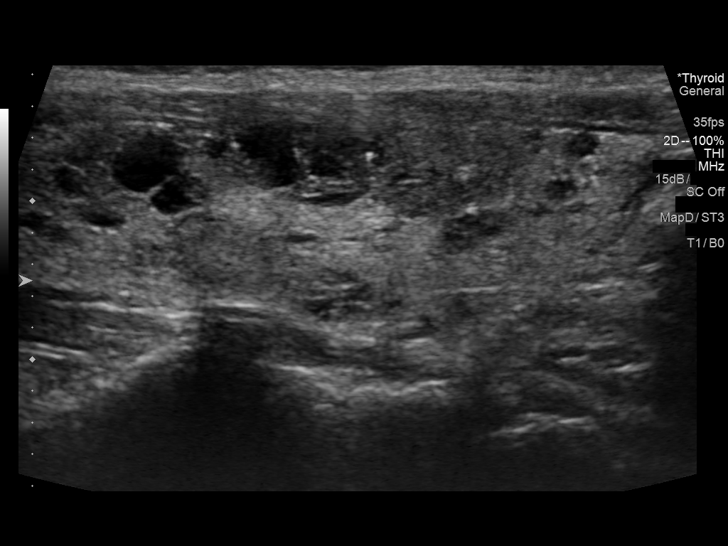
[im 52/55]
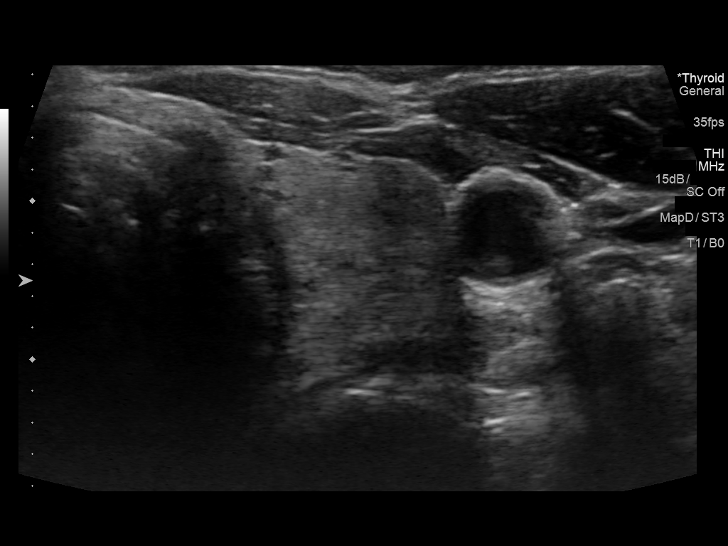

[12 of 25 positions shown; findings below may reference images not displayed]

06/08/2016; 01/16/2014 ultrasound-guided
bilateral thyroid nodule fine-needle aspiration-01/30/2014.
FINDINGS: Parenchymal Echotexture: Moderately heterogenous

Isthmus: Normal in size measures 0.3 cm in diameter, unchanged

Right lobe: Normal in size measuring 5.1 x 1.4 x 1.5 cm, unchanged,
previously, 5.0 x 1.9 x 1.8 cm

Left lobe: Normal in size measuring 5.2 x 1.6 x 1.4 cm, unchanged,
previously, 5.2 x 1.6 x 1.5 cm

_________________________________________________________

Estimated total number of nodules >/= 1 cm: 6-10

Number of spongiform nodules >/=  2 cm not described below (TR1): 0

Number of mixed cystic and solid nodules >/= 1.5 cm not described
below (TR2): 0

_________________________________________________________

The approximately 1.3 x 1.0 x 0.8 cm isoechoic ill-defined nodule
within the superior pole the right lobe of the thyroid (labeled 1),
is unchanged compared to the [DATE] examination, previously, 1.3 x
1.1 x 0.6 cm. Stability for greater than 5 years is indicative
benign etiology.

The approximately 1.1 x 1.0 x 0.5 cm partially cystic, partially
solid nodule within mid aspect the right lobe of the thyroid
(labeled 2), is unchanged to decreased in size compared to the
[DATE] examination, previously, 1.3 x 1.1 x 0.6 cm, as the nodule
has undergone interval partial cystic degeneration. Stability for
greater than 5 years is indicative benign etiology.

The previously biopsied approximately 1.4 x 1.1 x 0.7 cm isoechoic
nodule within the superior pole the right lobe of the thyroid
(labeled 3), is unchanged to decreased in size compared to the
[DATE] examination, previously, 1.7 x 1.0 x 0.8 cm. Correlation
with prior biopsy results is recommended.

The approximately 1.2 x 0.9 x 0.7 cm isoechoic nodule within the
inferior pole the right lobe of the thyroid (labeled 4), is
unchanged compared to the [DATE] examination, previously, 1.2 x
x 0.7 cm. Stability for greater than 5 years is indicative of benign
etiology.

_________________________________________________________

The approximately 1.2 x 0.9 x 0.7 cm ill-defined nodule/pseudonodule
within the superior pole the left lobe of the thyroid (labeled 5) is
unchanged compared to the [DATE] examination previously, 1.4 x
x 0.7 cm. Stability for greater than 5 years is indicative of a
benign etiology.

The previously biopsied approximately 1.5 x 1.0 x 0.6 cm partially
cystic, predominantly solid nodule within mid aspect the left lobe
of the thyroid (labeled 6), has decreased in size compared to the
[DATE] examination, previously, 1.9 x 1.0 x 0.7 cm. Correlation
prior biopsy results is recommended.

The approximately 1.0 x 0.8 x 0.6 cm ill-defined isoechoic
nodule/pseudonodule within the inferior pole the left lobe of the
thyroid (labeled 7), is grossly unchanged compared to the [DATE]
examination, previously, 0.8 x 0.8 x 0.7 cm slight differences
likely attributable to scan plane projection. Stability for greater
than 5 years is indicative of a benign etiology.

The approximately 0.9 x 0.7 x 0.5 cm ill-defined nodule/pseudonodule
within the inferior pole the left lobe of the thyroid (labeled 8),
is grossly unchanged compared to the [DATE] examination,
previously, 0.8 x 0.8 x 0.7 cm. Stability for greater than 5 years
is indicative of a benign etiology.
IMPRESSION: 1. Similar findings of multinodular goiter. No definitive new or
enlarging thyroid nodules.
2. Previously biopsied nodules within the right (labeled 3) and left
(labeled 7) lobes of the thyroid are unchanged compared to the
[DATE] examination. Correlation with prior biopsy results is
recommended. Assuming a benign pathologic diagnosis, repeat sampling
and/or continued dedicated follow-up is not recommended.
3. All of the remaining discretely measured thyroid nodules are
unchanged since the [DATE] examination. Stability for 5 years is
indicative of benign etiology and as such, continued dedicated
follow-up is not recommended.

The above is in keeping with the ACR TI-RADS recommendations - [HOSPITAL] 8280;[DATE].

## 2020-05-28 ENCOUNTER — Encounter: Payer: Self-pay | Admitting: Gastroenterology

## 2020-05-28 ENCOUNTER — Ambulatory Visit (INDEPENDENT_AMBULATORY_CARE_PROVIDER_SITE_OTHER): Payer: 59 | Admitting: Gastroenterology

## 2020-05-28 ENCOUNTER — Other Ambulatory Visit: Payer: Self-pay

## 2020-05-28 VITALS — BP 122/78 | HR 82 | Ht 63.75 in | Wt 119.0 lb

## 2020-05-28 DIAGNOSIS — R109 Unspecified abdominal pain: Secondary | ICD-10-CM

## 2020-05-28 DIAGNOSIS — R1010 Upper abdominal pain, unspecified: Secondary | ICD-10-CM

## 2020-05-28 DIAGNOSIS — R1013 Epigastric pain: Secondary | ICD-10-CM

## 2020-05-28 DIAGNOSIS — K580 Irritable bowel syndrome with diarrhea: Secondary | ICD-10-CM | POA: Diagnosis not present

## 2020-05-28 DIAGNOSIS — R140 Abdominal distension (gaseous): Secondary | ICD-10-CM

## 2020-05-28 NOTE — Progress Notes (Signed)
P  Chief Complaint:    Abdominal pain, diarrhea  GI History: 60 year old female with history of internal hemorrhoids, colon polyps, perianal fistula diagnosed on colonoscopy in 05/2012, multinodular goiter.   - Colonoscopy (2000, Dr. Oletta Lamas): Internal hemorrhoids, o/w normal. - Colonoscopy (04/2012, Dr. Oletta Lamas): 0.5 cm nodule noted 2-3 cm from anus on left buttocks, without anal fissure, internal hemorhoids. Normal colon. Recommended repeat in 5 years d/t FHx -MRI pelvis (05/2012): Grade 3/4 perianal fistula -01/2013: Seton placement by Dr. Marcello Moores.  Seton spontaneously removed in 2015 and fistula will drain periodically since then without fever, infection and only minimally bothersome to the patient -Colonoscopy (07/2018, Dr. Bryan Lemma): Scar from perianal fistula site without active drainage, nodular mucosa adjacent to AO (path: SSP), 15 mm flat polyp and a sending (tattoo placed), single sigmoid diverticulum, internal hemorrhoids.  Well-healed scar in the mid rectum.  Otherwise normal mucosa (path benign, no MC) -Colonoscopy (08/2018, Dr. Bryan Lemma): Perianal scar without drainage, 8 mm cecal polyp (SSP), 20 mm flat polyp and a sending removed via piecemeal EMR then clips x5 (path SSP), single sigmoid diverticulum, internal hemorrhoids.  Repeat 1 year -Colonoscopy (10/2019, Dr. Bryan Lemma): Scar in cecum with subtle nodularity (path: SSP), Scar in ascending colon with 5 mm residual polypoid tissue (path: HP), 2 mm sigmoid polyp (path: HP), Sigmoid diverticulosis, internal hemorrhoids.  Repeat in 2 years   FHx n/f brothers x2 with advanced colon polyps. Maternal Grandmother with ?Crohns Disease.    HPI:     Patient is a 60 y.o. female presenting to the [facility] for follow-up.  Initially seen by me on 06/14/2018 (virtual appointment) with evaluation as above.  Today, main issue is intermittent LUQ pain and intermittent diarrhea.  She has a longstanding history of  postprandial loose stools/diarrhea and lower abdominal cramping/bloating.  No hematochezia or melena.  Symptoms of been present for a number of years, and was previously diagnosed with IBS by Dr. Oletta Lamas.  Not sure what medications she has trialed before, but has not taken anything for this in many years.  Sxs worse with increased work stress (works for [employer], but work position has changed with reduced stress; works from home).  Symptoms are not daily.  She does avoid eating prior to travel due to concern that she may develop postprandial diarrhea.  Somewhat separately, has had postprandial LUQ/epigastric pain.  Typically occurs within 30-60 minutes of eating.  Is not daily, and no certain foods can predictably cause symptoms.  The symptoms seem somewhat newer, but certainly present for a number of months now.  No nausea or vomiting.  No new labs or recent abdominal imaging for review.   Review of systems:     No chest pain, no SOB, no fevers, no urinary sx   Past Medical History:  Diagnosis Date  . Anal fistula    HAVING BLEEDING AT TIMES AND UNCOMFORTABLE   . Complication of anesthesia    SLOW TO WAKE UP - EVEN AFTER COLONOSCOPY - I GO HOME AND SLEEP FOR HOURS  . History of kidney stones    HAD LITHO - BUT IT DID NOT CRUSH THE STONE - STILL HAVE THE STONE  . Internal hemorrhoids   . Kidney stone    found on CT scan  . Labral tear of right hip joint   . Multinodular goiter    "my scan this year , they said it looked really good"   . Multinodular goiter (nontoxic)    PT STATES HER THROID LEVELS  WERE OK WHEN CHECKED IN OCT 2014  . Osteoporosis   . Wears contact lenses   . Wears eyeglasses     Patient's surgical history, family medical history, social history, medications and allergies were all reviewed in Epic    Current Outpatient Medications  Medication Sig Dispense Refill  . diazepam (VALIUM) 5 MG tablet Take 5 mg by mouth daily as needed (sleep).     Marland Kitchen ibuprofen (ADVIL) 200  MG tablet Take 200 mg by mouth every 6 (six) hours as needed (pain). Take one tablet for pain or discomfort    . Probiotic Product (PROBIOTIC PO) Take 1 tablet by mouth daily in the afternoon.    . Vitamin D, Cholecalciferol, 50 MCG (2000 UT) CAPS Take 2,000 Units by mouth daily.     No current facility-administered medications for this visit.    Physical Exam:     BP 122/78   Pulse 82   Ht 5' 3.75" (1.619 m)   Wt 119 lb (54 kg)   BMI 20.59 kg/m   GENERAL:  Pleasant female in NAD PSYCH: : Cooperative, normal affect EENT:  conjunctiva pink, mucous membranes moist, neck supple without masses CARDIAC:  RRR, no murmur heard, no peripheral edema PULM: Normal respiratory effort, lungs CTA bilaterally, no wheezing ABDOMEN:  Nondistended, soft, nontender. No obvious masses, no hepatomegaly,  normal bowel sounds SKIN:  turgor, no lesions seen Musculoskeletal:  Normal muscle tone, normal strength NEURO: Alert and oriented x 3, no focal neurologic deficits   IMPRESSION and PLAN:    1) IBS-D 2) Lower abdominal cramping 3) Abdominal bloating Lower GI symptoms certainly seen c/w IBS-D.  Discussed diagnosis at length today with plan as follows: -Trial course of Bentyl prn -Start low FODMAP diet.  Provided with handout and detailed instructions today -Diet diary -Okay to trial course of probiotics -Okay to use Imodium or Lomotil prn, particularly prior to travel or other stressful events which tend to incite symptoms  4) LUQ/epigastric pain Discussed broad DDx to include PUD, gastritis, nonulcer dyspepsia, and less likely biliary etiology.  Plan as follows: -Trial course of FD guard -HIDA scan to evaluate for atypical biliary pathology -Check LAEs -If unrevealing and ongoing symptoms, plan for EGD and possible RUQ [place]  - RTC in 3-6 months or sooner prn  I spent 35 minutes of time, including in depth chart review, independent review of results as outlined above, communicating results  with the patient directly, face-to-face time with the patient, coordinating care, and ordering studies and medications as appropriate, and documentation.           [place] ,DO, FACG 05/28/2020, 11:07 AM

## 2020-05-28 NOTE — Patient Instructions (Addendum)
If you are age 62 or older, your body mass index should be between 23-30. Your Body mass index is 20.59 kg/m. If this is out of the aforementioned range listed, please consider follow up with your Primary Care Provider.  If you are age 60 or younger, your body mass index should be between 19-25. Your Body mass index is 20.59 kg/m. If this is out of the aformentioned range listed, please consider follow up with your Primary Care Provider.   You will be contacted by [facility] in the next 2 days to arrange a [facility] Scan.  The number on your caller ID will be [phone], please answer when they call.  If you have not heard from them in 2 days please call [phone] to schedule.    We have given you samples of FD guard and a LOW FodMAP diet to follow. You may try Imodium for your diarrhea as needed.  Please go to the second floor [address] for labwork today.  Please call to schedule a follow up appointment in 3-6 months.

## 2020-09-18 ENCOUNTER — Other Ambulatory Visit: Payer: Self-pay | Admitting: Gastroenterology

## 2020-09-19 LAB — HEPATIC FUNCTION PANEL
ALT: 15 IU/L (ref 0–32)
AST: 24 IU/L (ref 0–40)
Albumin: 4.8 g/dL (ref 3.8–4.8)
Alkaline Phosphatase: 68 IU/L (ref 44–121)
Bilirubin Total: 0.2 mg/dL (ref 0.0–1.2)
Bilirubin, Direct: <0.1 mg/dL (ref 0.00–0.40)
Total Protein: 6.7 g/dL (ref 6.0–8.5)

## 2021-10-10 ENCOUNTER — Encounter: Payer: Self-pay | Admitting: Gastroenterology

## 2021-10-30 ENCOUNTER — Other Ambulatory Visit: Payer: Self-pay | Admitting: *Deleted

## 2021-10-30 NOTE — Progress Notes (Unsigned)
Opened in error

## 2021-11-04 ENCOUNTER — Other Ambulatory Visit: Payer: Self-pay | Admitting: Registered Nurse

## 2021-11-04 DIAGNOSIS — E785 Hyperlipidemia, unspecified: Secondary | ICD-10-CM

## 2021-11-18 ENCOUNTER — Ambulatory Visit (AMBULATORY_SURGERY_CENTER): Payer: Self-pay

## 2021-11-18 ENCOUNTER — Other Ambulatory Visit: Payer: Self-pay

## 2021-11-18 VITALS — Ht 63.5 in | Wt 119.6 lb

## 2021-11-18 DIAGNOSIS — Z86010 Personal history of colonic polyps: Secondary | ICD-10-CM

## 2021-11-18 MED ORDER — NA SULFATE-K SULFATE-MG SULF 17.5-3.13-1.6 GM/177ML PO SOLN: 1.0000 | Freq: Once | ORAL | 0 refills | Status: AC

## 2021-11-18 NOTE — Progress Notes (Signed)
Denies allergies to eggs or soy products. Denies complication of anesthesia or sedation. Denies use of weight loss medication. Denies use of O2.   Emmi instructions given for colonoscopy.  

## 2021-11-21 ENCOUNTER — Encounter: Payer: Self-pay | Admitting: Gastroenterology

## 2021-12-08 ENCOUNTER — Ambulatory Visit (AMBULATORY_SURGERY_CENTER): Payer: 59 | Admitting: Gastroenterology

## 2021-12-08 ENCOUNTER — Encounter: Payer: Self-pay | Admitting: Gastroenterology

## 2021-12-08 VITALS — BP 135/66 | HR 72 | Temp 98.0°F | Resp 24 | Ht 63.75 in | Wt 119.6 lb

## 2021-12-08 DIAGNOSIS — Z09 Encounter for follow-up examination after completed treatment for conditions other than malignant neoplasm: Secondary | ICD-10-CM

## 2021-12-08 DIAGNOSIS — Z86010 Personal history of colonic polyps: Secondary | ICD-10-CM | POA: Diagnosis not present

## 2021-12-08 DIAGNOSIS — K5730 Diverticulosis of large intestine without perforation or abscess without bleeding: Secondary | ICD-10-CM

## 2021-12-08 DIAGNOSIS — R109 Unspecified abdominal pain: Secondary | ICD-10-CM

## 2021-12-08 MED ORDER — ONDANSETRON HCL 4 MG PO TABS
4.0000 mg | ORAL_TABLET | Freq: Once | ORAL | Status: AC
  Administered 2021-12-08: 4 mg via ORAL

## 2021-12-08 MED ORDER — SODIUM CHLORIDE 0.9 % IV SOLN: 500.0000 mL | Freq: Once | INTRAVENOUS | Status: DC

## 2021-12-08 NOTE — Patient Instructions (Signed)

## 2021-12-08 NOTE — Progress Notes (Signed)
GASTROENTEROLOGY PROCEDURE H&P NOTE   Primary Care Physician: Ginger Organ., MD    Reason for Procedure:  Colon Cancer screening, colon polyp surveillance  Plan:    Colonoscopy  Patient is appropriate for endoscopic procedure(s) in the ambulatory ([facility]) setting.  The nature of the procedure, as well as the risks, benefits, and alternatives were carefully and thoroughly reviewed with the patient. Ample time for discussion and questions allowed. The patient understood, was satisfied, and agreed to proceed.     HPI: Heather Chandler is a 61 y.o. female who presents for colonoscopy for continued colon polyp surveillance.  - Colonoscopy (2000, Dr. Oletta Lamas): Internal hemorrhoids, o/w normal. - Colonoscopy (04/2012, Dr. Oletta Lamas): 0.5 cm nodule noted 2-3 cm from anus on left buttocks, without anal fissure, internal hemorhoids. Normal colon. Recommended repeat in 5 years d/t FHx -MRI pelvis (05/2012): Grade 3/4 perianal fistula -01/2013: Seton placement by Dr. Marcello Moores.  Seton spontaneously removed in 2015 and fistula will drain periodically since then without fever, infection and only minimally bothersome to the patient -Colonoscopy (07/2018, Dr. Bryan Lemma): Scar from perianal fistula site without active drainage, nodular mucosa adjacent to AO (path: SSP), 15 mm flat polyp in ascending (tattoo placed), single sigmoid diverticulum, internal hemorrhoids.  Well-healed scar in the mid rectum.  Otherwise normal mucosa (path benign, no MC) -Colonoscopy (08/2018, Dr. Bryan Lemma): Perianal scar without drainage, 8 mm cecal polyp (SSP), 20 mm flat polyp in ascending removed via piecemeal EMR then clips x5 (path SSP), single sigmoid diverticulum, internal hemorrhoids.  Repeat 1 year -Colonoscopy (10/2019, Dr. Bryan Lemma): Scar in cecum with subtle nodularity (path: SSP), Scar in ascending colon with 5 mm residual polypoid tissue (path: HP), 2 mm sigmoid polyp (path: HP), Sigmoid diverticulosis, internal  hemorrhoids.  Repeat in 2 years     FHx n/f brothers x2 with advanced colon polyps. Maternal Grandmother with ?Crohns Disease.   Past Medical History:  Diagnosis Date   Allergy    Anal fistula    HAVING BLEEDING AT TIMES AND UNCOMFORTABLE    Anxiety    Arthritis    Complication of anesthesia    SLOW TO WAKE UP - EVEN AFTER COLONOSCOPY - I GO HOME AND SLEEP FOR HOURS   History of kidney stones    HAD LITHO - BUT IT DID NOT CRUSH THE STONE - STILL HAVE THE STONE   Hyperlipidemia    Internal hemorrhoids    Kidney stone    found on CT scan   Labral tear of right hip joint    Multinodular goiter    "my scan this year , they said it looked really good"    Multinodular goiter (nontoxic)    PT STATES HER THROID LEVELS WERE OK WHEN CHECKED IN OCT 2014   Osteoporosis    Wears contact lenses    Wears eyeglasses     Past Surgical History:  Procedure Laterality Date   BREAST SURGERY     enhancement Sx   CARDIOVASCULAR STRESS TEST  several years ago   indication at the time was "some chest pain; i think i was being paranoid, anywya the doctor said my heart was very strong"    COLONOSCOPY     COLONOSCOPY  08/09/2018   COLONOSCOPY WITH PROPOFOL N/A 09/09/2018   Procedure: COLONOSCOPY WITH PROPOFOL;  Surgeon: Lavena Bullion, DO;  Location: [facility];  Service: Gastroenterology;  Laterality: N/A;   ENDOSCOPIC MUCOSAL RESECTION N/A 09/09/2018   Procedure: ENDOSCOPIC MUCOSAL RESECTION;  Surgeon: Lavena Bullion,  DO;  Location: [facility];  Service: Gastroenterology;  Laterality: N/A;   EVALUATION UNDER ANESTHESIA WITH ANAL FISTULECTOMY N/A 03/01/2013   Procedure: EXAM UNDER ANESTHESIA WITH, ANAL BIOPSY, SETON PLACEMENT;  Surgeon: Pedro Earls, MD;  Location: [facility];  Service: General;  Laterality: N/A;   HEMOSTASIS CLIP PLACEMENT  09/09/2018   Procedure: HEMOSTASIS CLIP PLACEMENT;  Surgeon: Lavena Bullion, DO;  Location: [facility];  Service: Gastroenterology;;    LAPAROSCOPY     LITHOTRIPSY  2010   unsucessful, too alrge, still in place    RECTAL ULTRASOUND N/A 04/19/2013   Procedure: anal ULTRASOUND;  Surgeon: Leighton Ruff, MD;  Location: [facility];  Service: Endoscopy;  Laterality: N/A;  pt tol procedure well...segment of anal muscle with predominant scar tissue...discussion by Dr. Marcello Moores with pt in procedure room prior to leaving ...   REVISION OF SCAR     SUBMUCOSAL LIFTING INJECTION  09/09/2018   Procedure: SUBMUCOSAL LIFTING INJECTION;  Surgeon: Lavena Bullion, DO;  Location: [facility];  Service: Gastroenterology;;   TONSILLECTOMY      Prior to Admission medications   Medication Sig Start Date End Date Taking? Authorizing Provider  diazepam (VALIUM) 5 MG tablet Take 5 mg by mouth daily as needed (sleep).  04/25/12  Yes [provider]  ibuprofen (ADVIL) 200 MG tablet Take 200 mg by mouth every 6 (six) hours as needed (pain). Take one tablet for pain or discomfort    [provider]  Probiotic Product (PROBIOTIC PO) Take 1 tablet by mouth daily in the afternoon.    [provider]  Vitamin D, Cholecalciferol, 50 MCG (2000 UT) CAPS Take 2,000 Units by mouth daily.    [provider]    Current Outpatient Medications  Medication Sig Dispense Refill   diazepam (VALIUM) 5 MG tablet Take 5 mg by mouth daily as needed (sleep).      ibuprofen (ADVIL) 200 MG tablet Take 200 mg by mouth every 6 (six) hours as needed (pain). Take one tablet for pain or discomfort     Probiotic Product (PROBIOTIC PO) Take 1 tablet by mouth daily in the afternoon.     Vitamin D, Cholecalciferol, 50 MCG (2000 UT) CAPS Take 2,000 Units by mouth daily.     Current Facility-Administered Medications  Medication Dose Route Frequency Provider Last Rate Last Admin   0.9 %  sodium chloride infusion  500 mL Intravenous Once Wardell Pokorski V, DO        Allergies as of 12/08/2021 - Review Complete 12/08/2021  Allergen Reaction Noted    Ceclor [cefaclor] Rash 05/19/2012   Codeine  05/19/2012   Penicillins Rash 05/19/2012   Flagyl [metronidazole]  02/23/2013   Morphine and related  02/23/2013    Family History  Problem Relation Age of Onset   Cancer Mother        breast & uterian   Cancer Father        leukemia   Cancer Maternal Aunt        breast   Cancer Maternal Grandmother        breast   Cancer Maternal Grandfather        lung   Colon polyps Brother        x2   Colon cancer Neg Hx    Esophageal cancer Neg Hx    Rectal cancer Neg Hx    Stomach cancer Neg Hx     Social History   Socioeconomic History   Marital status: Single  Spouse name: Not on file   Number of children: Not on file   Years of education: Not on file   Highest education level: Not on file  Occupational History   Not on file  Tobacco Use   Smoking status: Never   Smokeless tobacco: Never  Vaping Use   Vaping Use: Never used  Substance and Sexual Activity   Alcohol use: Yes    Alcohol/week: 5.0 standard drinks of alcohol    Types: 5 Standard drinks or equivalent per week    Comment: COUPLE GLASSES WINE OR A BEER PER WEEK   Drug use: No   Sexual activity: Not on file  Other Topics Concern   Not on file  Social History Narrative   Not on file   Social Determinants of Health   Financial Resource Strain: Not on file  Food Insecurity: Not on file  Transportation Needs: Not on file  Physical Activity: Not on file  Stress: Not on file  Social Connections: Not on file  Intimate Partner Violence: Not on file    Physical Exam: Vital signs in last 24 hours: '@BP'$  134/84   Pulse 97   Temp 98 F (36.7 C)   Ht 5' 3.75" (1.619 m)   Wt 119 lb 9.6 oz (54.3 kg)   SpO2 98%   BMI 20.69 kg/m  GEN: NAD EYE: Sclerae anicteric ENT: MMM CV: Non-tachycardic Pulm: CTA b/l GI: Soft, NT/ND NEURO:  Alert & Oriented x 3   Gerrit Heck, DO [facility]   12/08/2021 1:11 PM

## 2021-12-08 NOTE — Progress Notes (Signed)
Pt's states no medical or surgical changes since previsit or office visit. 

## 2021-12-08 NOTE — Op Note (Signed)
[place] Patient Name: Heather Chandler Procedure Date: 12/08/2021 1:30 PM MRN: [id number] Endoscopist: Gerrit Heck , MD Age: 61 Referring MD:  Date of Birth: Sep 11, 1960 Gender: Female Account #: [id number] Procedure:                Colonoscopy Indications:              High risk colon cancer surveillance: Personal                            history of sessile serrated colon polyp (10 mm or                            greater in size)                           - Colonoscopy (2000, Dr. Oletta Lamas): Internal                            hemorrhoids, o/w normal.                           - Colonoscopy (04/2012, Dr. Oletta Lamas): 0.5 cm nodule                            noted 2-3 cm from anus on left buttocks, without                            anal fissure, internal hemorhoids. Normal colon.                            Recommended repeat in 5 years d/t FHx                           ?"Colonoscopy (07/2018, Dr. Bryan Lemma): Scar from                            perianal fistula site without active drainage,                            nodular mucosa adjacent toAO(path: SSP), 15 mm                            flat polyp in ascending (tattoo placed), single                            sigmoid diverticulum, internal hemorrhoids.                            Well-healed scar in the mid rectum. Otherwise                            normal mucosa (path benign, no MC)                           ?"Colonoscopy (08/2018, Dr. Bryan Lemma): Perianal  scar without drainage, 8 mm cecal polyp (SSP), 20                            mm flat polyp in ascending removed via piecemeal                            EMR then clips x5 (path SSP), single sigmoid                            diverticulum, internal hemorrhoids. Repeat 1 year                           ?"Colonoscopy (10/2019, Dr. Bryan Lemma): Scar in                            cecum with subtle nodularity (path: SSP), Scar in                             ascending colon with 5 mm residual polypoid tissue                            (path: HP), 2 mm sigmoid polyp (path: HP), Sigmoid                            diverticulosis, internal hemorrhoids. Repeat in 2                            years Medicines:                Monitored Anesthesia Care Procedure:                Pre-Anesthesia Assessment:                           - Prior to the procedure, a History and Physical                            was performed, and patient medications and                            allergies were reviewed. The patient's tolerance of                            previous anesthesia was also reviewed. The risks                            and benefits of the procedure and the sedation                            options and risks were discussed with the patient.                            All questions were answered, and informed consent  was obtained. Prior Anticoagulants: The patient has                            taken no previous anticoagulant or antiplatelet                            agents. ASA Grade Assessment: II - A patient with                            mild systemic disease. After reviewing the risks                            and benefits, the patient was deemed in                            satisfactory condition to undergo the procedure.                           After obtaining informed consent, the colonoscope                            was passed under direct vision. Throughout the                            procedure, the patient's blood pressure, pulse, and                            oxygen saturations were monitored continuously. The                            Olympus CF-HQ190L [id number]) Colonoscope was                            introduced through the anus and advanced to the the                            cecum, identified by appendiceal orifice and                            ileocecal valve. The  colonoscopy was performed                            without difficulty. The patient tolerated the                            procedure well. The quality of the bowel                            preparation was good. The ileocecal valve,                            appendiceal orifice, and rectum were photographed. Scope In: 1:33:42 PM Scope Out: 1:48:39 PM Scope Withdrawal Time: 0 hours 10 minutes 58 seconds  Total Procedure Duration: 0  hours 14 minutes 57 seconds  Findings:                 The perianal exam findings include scar.                           A tattoo was seen in the ascending colon. The                            tattoo site appeared normal. No evidence of                            residual polyp noted. Made several passes through                            the ascending colon and hepatic flexure, and no                            residual or new polyp noted.                           A few small-mouthed diverticula were found in the                            sigmoid colon.                           A small post polypectomy scar was found in the                            cecum. The scar tissue was healthy in appearance.                            No residual polyp noted on white light and                            narrow-band imaging.                           The colon otherwise appeared normal throughout.                           The retroflexed view of the distal rectum and anal                            verge was normal and showed no anal or rectal                            abnormalities. Complications:            No immediate complications. Estimated Blood Loss:     Estimated blood loss: none. Impression:               - Scar found on perianal exam.                           -  A tattoo was seen in the ascending colon. The                            tattoo site appeared normal.                           - Diverticulosis in the sigmoid colon.                            - Post-polypectomy scar in the cecum.                           - The entire examined colon is normal.                           - The distal rectum and anal verge are normal on                            retroflexion view.                           - No specimens collected. Recommendation:           - Patient has a contact number available for                            emergencies. The signs and symptoms of potential                            delayed complications were discussed with the                            patient. Return to normal activities tomorrow.                            Written discharge instructions were provided to the                            patient.                           - Resume previous diet.                           - Continue present medications.                           - Repeat colonoscopy in 5 years for surveillance.                           - Return to GI office PRN. Gerrit Heck, MD 12/08/2021 1:55:28 PM

## 2021-12-08 NOTE — Progress Notes (Signed)
Pt resting comfortably. VSS. Airway intact. SBAR complete to RN. All questions answered.   

## 2021-12-09 ENCOUNTER — Telehealth: Payer: Self-pay

## 2021-12-09 NOTE — Telephone Encounter (Signed)
Post procedure follow up call, no answer 

## 2022-01-09 ENCOUNTER — Other Ambulatory Visit: Payer: 59

## 2022-01-16 ENCOUNTER — Other Ambulatory Visit: Payer: 59

## 2022-07-20 ENCOUNTER — Other Ambulatory Visit: Payer: Self-pay | Admitting: *Deleted

## 2022-07-20 DIAGNOSIS — M79605 Pain in left leg: Secondary | ICD-10-CM

## 2022-07-20 DIAGNOSIS — M79604 Pain in right leg: Secondary | ICD-10-CM

## 2022-07-28 ENCOUNTER — Encounter: Payer: Self-pay | Admitting: Vascular Surgery

## 2022-07-28 ENCOUNTER — Ambulatory Visit: Payer: 59 | Admitting: Vascular Surgery

## 2022-07-28 ENCOUNTER — Ambulatory Visit (HOSPITAL_COMMUNITY)
Admission: RE | Admit: 2022-07-28 | Discharge: 2022-07-28 | Disposition: A | Discharge status: Home / Self Care | Payer: 59 | Source: Ambulatory Visit | Attending: Vascular Surgery | Admitting: Vascular Surgery

## 2022-07-28 VITALS — BP 121/77 | HR 79 | Temp 97.8°F | Resp 14 | Ht 63.5 in | Wt 116.0 lb

## 2022-07-28 DIAGNOSIS — M79604 Pain in right leg: Secondary | ICD-10-CM | POA: Diagnosis present

## 2022-07-28 DIAGNOSIS — M79605 Pain in left leg: Secondary | ICD-10-CM | POA: Diagnosis present

## 2022-07-28 DIAGNOSIS — I872 Venous insufficiency (chronic) (peripheral): Secondary | ICD-10-CM | POA: Diagnosis not present

## 2022-07-28 NOTE — Progress Notes (Signed)
Patient name: Heather Chandler MRN: [id number] DOB: 08/24/1960 Sex: female  REASON FOR CONSULT: Varicose veins of bilateral lower extremities  HPI: Heather Chandler is a 62 y.o. female, with history of hyperlipidemia that presents for evaluation of varicose veins in her bilateral lower extremities.  She states these have been present for years.  She just wants to get them checked out.  She is more concerned about a cluster of veins behind her right calf that she states gets swollen and sometimes uncomfortable throughout the day.  No history of DVT.  Not wearing compression stockings.  Works from home.  No previous venous interventions.  Past Medical History:  Diagnosis Date   Allergy    Anal fistula    HAVING BLEEDING AT TIMES AND UNCOMFORTABLE    Anxiety    Arthritis    Complication of anesthesia    SLOW TO WAKE UP - EVEN AFTER COLONOSCOPY - I GO HOME AND SLEEP FOR HOURS   History of kidney stones    HAD LITHO - BUT IT DID NOT CRUSH THE STONE - STILL HAVE THE STONE   Hyperlipidemia    Internal hemorrhoids    Kidney stone    found on CT scan   Labral tear of right hip joint    Multinodular goiter    "my scan this year , they said it looked really good"    Multinodular goiter (nontoxic)    PT STATES HER THROID LEVELS WERE OK WHEN CHECKED IN OCT 2014   Osteoporosis    Wears contact lenses    Wears eyeglasses     Past Surgical History:  Procedure Laterality Date   BREAST SURGERY     enhancement Sx   CARDIOVASCULAR STRESS TEST  several years ago   indication at the time was "some chest pain; i think i was being paranoid, anywya the doctor said my heart was very strong"    COLONOSCOPY     COLONOSCOPY  08/09/2018   COLONOSCOPY WITH PROPOFOL N/A 09/09/2018   Procedure: COLONOSCOPY WITH PROPOFOL;  Surgeon: Shellia Cleverly, DO;  Location: [facility];  Service: Gastroenterology;  Laterality: N/A;   ENDOSCOPIC MUCOSAL RESECTION N/A 09/09/2018   Procedure: ENDOSCOPIC MUCOSAL  RESECTION;  Surgeon: Shellia Cleverly, DO;  Location: [facility];  Service: Gastroenterology;  Laterality: N/A;   EVALUATION UNDER ANESTHESIA WITH ANAL FISTULECTOMY N/A 03/01/2013   Procedure: EXAM UNDER ANESTHESIA WITH, ANAL BIOPSY, SETON PLACEMENT;  Surgeon: Valarie Merino, MD;  Location: [facility];  Service: General;  Laterality: N/A;   HEMOSTASIS CLIP PLACEMENT  09/09/2018   Procedure: HEMOSTASIS CLIP PLACEMENT;  Surgeon: Shellia Cleverly, DO;  Location: [facility];  Service: Gastroenterology;;   LAPAROSCOPY     LITHOTRIPSY  2010   unsucessful, too alrge, still in place    RECTAL ULTRASOUND N/A 04/19/2013   Procedure: anal ULTRASOUND;  Surgeon: Romie Levee, MD;  Location: [facility];  Service: Endoscopy;  Laterality: N/A;  pt tol procedure well...segment of anal muscle with predominant scar tissue...discussion by Dr. Maisie Fus with pt in procedure room prior to leaving ...   REVISION OF SCAR     SUBMUCOSAL LIFTING INJECTION  09/09/2018   Procedure: SUBMUCOSAL LIFTING INJECTION;  Surgeon: Shellia Cleverly, DO;  Location: [facility];  Service: Gastroenterology;;   TONSILLECTOMY      Family History  Problem Relation Age of Onset   Cancer Mother        breast & Raj Janus   Cancer Father  leukemia   Cancer Maternal Aunt        breast   Cancer Maternal Grandmother        breast   Cancer Maternal Grandfather        lung   Colon polyps Brother        x2   Colon cancer Neg Hx    Esophageal cancer Neg Hx    Rectal cancer Neg Hx    Stomach cancer Neg Hx     SOCIAL HISTORY: Social History   Socioeconomic History   Marital status: Single    Spouse name: Not on file   Number of children: Not on file   Years of education: Not on file   Highest education level: Not on file  Occupational History   Not on file  Tobacco Use   Smoking status: Never   Smokeless tobacco: Never  Vaping Use   Vaping Use: Never used  Substance and Sexual Activity   Alcohol use: Yes     Alcohol/week: 5.0 standard drinks of alcohol    Types: 5 Standard drinks or equivalent per week    Comment: COUPLE GLASSES WINE OR A BEER PER WEEK   Drug use: No   Sexual activity: Not on file  Other Topics Concern   Not on file  Social History Narrative   Not on file   Social Determinants of Health   Financial Resource Strain: Not on file  Food Insecurity: Not on file  Transportation Needs: Not on file  Physical Activity: Not on file  Stress: Not on file  Social Connections: Not on file  Intimate Partner Violence: Not on file    Allergies  Allergen Reactions   Ceclor [Cefaclor] Rash   Codeine      REACTION AS A CHILD -  FAINTED --DID NOT HAVE TO GO TO ER   Penicillins Rash    Has patient had a PCN reaction causing immediate rash, facial/tongue/throat swelling, SOB or lightheadedness with hypotension: yes Has patient had a PCN reaction causing severe rash involving mucus membranes or skin necrosis:yes  Has patient had a PCN reaction that required hospitalization: no Has patient had a PCN reaction occurring within the last 10 years: yes If all of the above answers are "NO", then may proceed with Cephalosporin use.    Flagyl [Metronidazole]      CAUSED DIARRHEA,    Morphine And Related     GI upset    Current Outpatient Medications  Medication Sig Dispense Refill   diazepam (VALIUM) 5 MG tablet Take 5 mg by mouth daily as needed (sleep).      ibuprofen (ADVIL) 200 MG tablet Take 200 mg by mouth every 6 (six) hours as needed (pain). Take one tablet for pain or discomfort     Probiotic Product (PROBIOTIC PO) Take 1 tablet by mouth daily in the afternoon.     Vitamin D, Cholecalciferol, 50 MCG (2000 UT) CAPS Take 2,000 Units by mouth daily.     No current facility-administered medications for this visit.    REVIEW OF SYSTEMS:  [X]  denotes positive finding, [ ]  denotes negative finding Cardiac  Comments:  Chest pain or chest pressure:    Shortness of breath upon  exertion:    Short of breath when lying flat:    Irregular heart rhythm:        Vascular    Pain in calf, thigh, or hip brought on by ambulation:    Pain in feet at night that wakes you  up from your sleep:     Blood clot in your veins:    Leg swelling:         Pulmonary    Oxygen at home:    Productive cough:     Wheezing:         Neurologic    Sudden weakness in arms or legs:     Sudden numbness in arms or legs:     Sudden onset of difficulty speaking or slurred speech:    Temporary loss of vision in one eye:     Problems with dizziness:         Gastrointestinal    Blood in stool:     Vomited blood:         Genitourinary    Burning when urinating:     Blood in urine:        Psychiatric    Major depression:         Hematologic    Bleeding problems:    Problems with blood clotting too easily:        Skin    Rashes or ulcers:        Constitutional    Fever or chills:      PHYSICAL EXAM: There were no vitals filed for this visit.  GENERAL: The patient is a well-nourished female, in no acute distress. The vital signs are documented above. CARDIAC: There is a regular rate and rhythm.  VASCULAR:  Bilateral femoral pulses palpable Bilateral PT pulses palpable Notable spider and reticular veins in bilateral lower extremities Cluster reticular veins behind right knee PULMONARY: No respiratory distress. ABDOMEN: Soft and non-tender. MUSCULOSKELETAL: There are no major deformities or cyanosis. NEUROLOGIC: No focal weakness or paresthesias are detected. SKIN: There are no ulcers or rashes noted. PSYCHIATRIC: The patient has a normal affect.  DATA:    Lower Venous Reflux Study   Patient Name:  JANNETT LAGLE  Date of Exam:   07/28/2022  Medical Rec #: [id number]      Accession #:    [id number]  Date of Birth: Mar 19, 1960      Patient Gender: F  Patient Age:   52 years  Exam Location:  [facility]  Procedure:      VAS [place] LOWER EXTREMITY VENOUS  REFLUX  Referring Phys: Sherald Hess    ---------------------------------------------------------------------------  -----    Indications: Varicosities.    Comparison Study: No prior study   Performing Technologist: Gertie Fey MHA, RDMS, RVT, RDCS     Examination Guidelines: A complete evaluation includes B-mode imaging,  spectral  Doppler, color Doppler, and power Doppler as needed of all accessible  portions  of each vessel. Bilateral testing is considered an integral part of a  complete  examination. Limited examinations for reoccurring indications may be  performed  as noted. The reflux portion of the exam is performed with the patient in  reverse Trendelenburg.  Significant venous reflux is defined as >500 ms in the superficial venous  system, and >1 second in the deep venous system.     Venous Reflux Times  +--------------+---------+------+-----------+------------+-----------------  ----+  RIGHT        Reflux NoRefluxReflux TimeDiameter cmsComments                                        Yes                                                 +--------------+---------+------+-----------+------------+-----------------  ----+  CFV          no                                                            +--------------+---------+------+-----------+------------+-----------------  ----+  FV mid        no                                                            +--------------+---------+------+-----------+------------+-----------------  ----+  Popliteal    no                                                            +--------------+---------+------+-----------+------------+-----------------  ----+  GSV at [facility]    no                            0.44                            +--------------+---------+------+-----------+------------+-----------------  ----+  GSV prox thighno                            0.19                             +--------------+---------+------+-----------+------------+-----------------  ----+  GSV mid thigh           yes    >500 ms      0.17    OOF just distal  to                                                         area of reflux          +--------------+---------+------+-----------+------------+-----------------  ----+  GSV dist thigh                                      out of fascia           +--------------+---------+------+-----------+------------+-----------------  ----+  SSV prox calf no                            0.23                            +--------------+---------+------+-----------+------------+-----------------  ----+         Summary:  Right:  - Evidence of acute intramuscular vein thrombosis involving the  gastrocnemius veins.  - No evidence of superficial venous thrombosis  in the right lower  extremity.    - The deep venous system is competent.  - The great saphenous vein is incompetent at the mid thigh.  - The small saphenous vein is competent.      Assessment/Plan:  62 year old female presents for evaluation of lower extremity varicose veins.  I discussed that on exam she actually has mostly spider and reticular veins.  Her right leg reflux study shows no evidence of DVT.  Her deep venous system is competent.  She has one focal area of superficial reflux in the mid thigh in the great saphenous vein but the vein is very small.  I discussed for chronic venous insufficiency with valvular reflux we recommend leg elevation, exercise and compression stockings.  She would really only be a candidate for sclerotherapy.  I did have our sclerotherapy nurse meet with her and we looked at the cluster of reticular veins on the back of her right calf that is most bothersome.  She is going to decide if she wants to get these injected and will notify our office. We did get her sized for medical grade compression stockings and I  explained chronic venous insufficiency.     Cephus Shelling, MD [facility] Office: [phone]

## 2023-08-31 DIAGNOSIS — D225 Melanocytic nevi of trunk: Secondary | ICD-10-CM | POA: Diagnosis not present

## 2023-08-31 DIAGNOSIS — D2239 Melanocytic nevi of other parts of face: Secondary | ICD-10-CM | POA: Diagnosis not present

## 2023-08-31 DIAGNOSIS — L821 Other seborrheic keratosis: Secondary | ICD-10-CM | POA: Diagnosis not present

## 2023-08-31 DIAGNOSIS — D2262 Melanocytic nevi of left upper limb, including shoulder: Secondary | ICD-10-CM | POA: Diagnosis not present

## 2023-08-31 DIAGNOSIS — L820 Inflamed seborrheic keratosis: Secondary | ICD-10-CM | POA: Diagnosis not present

## 2023-10-14 DIAGNOSIS — M545 Low back pain, unspecified: Secondary | ICD-10-CM | POA: Diagnosis not present

## 2023-10-14 DIAGNOSIS — M542 Cervicalgia: Secondary | ICD-10-CM | POA: Diagnosis not present

## 2023-10-25 DIAGNOSIS — M47816 Spondylosis without myelopathy or radiculopathy, lumbar region: Secondary | ICD-10-CM | POA: Diagnosis not present

## 2023-11-23 DIAGNOSIS — H524 Presbyopia: Secondary | ICD-10-CM | POA: Diagnosis not present

## 2023-11-23 DIAGNOSIS — H5203 Hypermetropia, bilateral: Secondary | ICD-10-CM | POA: Diagnosis not present

## 2023-11-23 DIAGNOSIS — H269 Unspecified cataract: Secondary | ICD-10-CM | POA: Diagnosis not present
